# Patient Record
Sex: Male | Born: 1955
Health system: Southern US, Community
[De-identification: ages and names within clinical notes are randomized; demographics above are authoritative.]

## PROBLEM LIST (undated history)

## (undated) DIAGNOSIS — R945 Abnormal results of liver function studies: Secondary | ICD-10-CM

## (undated) DIAGNOSIS — N529 Male erectile dysfunction, unspecified: Secondary | ICD-10-CM

## (undated) DIAGNOSIS — F329 Major depressive disorder, single episode, unspecified: Secondary | ICD-10-CM

## (undated) DIAGNOSIS — N400 Enlarged prostate without lower urinary tract symptoms: Secondary | ICD-10-CM

## (undated) DIAGNOSIS — E786 Lipoprotein deficiency: Secondary | ICD-10-CM

## (undated) DIAGNOSIS — R7989 Other specified abnormal findings of blood chemistry: Secondary | ICD-10-CM

## (undated) DIAGNOSIS — I10 Essential (primary) hypertension: Secondary | ICD-10-CM

## (undated) DIAGNOSIS — K219 Gastro-esophageal reflux disease without esophagitis: Secondary | ICD-10-CM

## (undated) DIAGNOSIS — F32A Depression, unspecified: Secondary | ICD-10-CM

## (undated) DIAGNOSIS — T7840XA Allergy, unspecified, initial encounter: Secondary | ICD-10-CM

## (undated) HISTORY — DX: Other specified abnormal findings of blood chemistry: R79.89

## (undated) HISTORY — DX: Depression, unspecified: F32.A

## (undated) HISTORY — DX: Abnormal results of liver function studies: R94.5

## (undated) HISTORY — DX: Gastro-esophageal reflux disease without esophagitis: K21.9

## (undated) HISTORY — DX: Benign prostatic hyperplasia without lower urinary tract symptoms: N40.0

## (undated) HISTORY — DX: Allergy, unspecified, initial encounter: T78.40XA

## (undated) HISTORY — DX: Essential (primary) hypertension: I10

## (undated) HISTORY — PX: OTHER SURGICAL HISTORY: SHX169

## (undated) HISTORY — DX: Male erectile dysfunction, unspecified: N52.9

## (undated) HISTORY — DX: Major depressive disorder, single episode, unspecified: F32.9

## (undated) HISTORY — DX: Lipoprotein deficiency: E78.6

## (undated) HISTORY — PX: NASAL SEPTUM SURGERY: SHX37

---

## 1996-11-08 HISTORY — PX: OTHER SURGICAL HISTORY: SHX169

## 2001-03-10 ENCOUNTER — Emergency Department (HOSPITAL_COMMUNITY): Admission: EM | Admit: 2001-03-10 | Discharge: 2001-03-10 | Payer: Self-pay | Admitting: Emergency Medicine

## 2004-08-08 DIAGNOSIS — E786 Lipoprotein deficiency: Secondary | ICD-10-CM

## 2004-08-08 HISTORY — DX: Lipoprotein deficiency: E78.6

## 2004-12-07 ENCOUNTER — Ambulatory Visit: Payer: Self-pay | Admitting: Family Medicine

## 2005-05-03 ENCOUNTER — Ambulatory Visit: Payer: Self-pay | Admitting: Family Medicine

## 2005-06-25 ENCOUNTER — Ambulatory Visit: Payer: Self-pay | Admitting: Family Medicine

## 2006-09-05 ENCOUNTER — Ambulatory Visit: Payer: Self-pay | Admitting: Family Medicine

## 2009-07-08 ENCOUNTER — Encounter: Payer: Self-pay | Admitting: Family Medicine

## 2009-07-09 ENCOUNTER — Ambulatory Visit: Payer: Self-pay | Admitting: Family Medicine

## 2009-07-09 DIAGNOSIS — R079 Chest pain, unspecified: Secondary | ICD-10-CM | POA: Insufficient documentation

## 2009-07-10 LAB — CONVERTED CEMR LAB
ALT: 47 units/L (ref 0–53)
AST: 30 units/L (ref 0–37)
Albumin: 4.2 g/dL (ref 3.5–5.2)
Alkaline Phosphatase: 52 units/L (ref 39–117)
BUN: 10 mg/dL (ref 6–23)
Basophils Absolute: 0 10*3/uL (ref 0.0–0.1)
Basophils Relative: 0.3 % (ref 0.0–3.0)
Bilirubin, Direct: 0.1 mg/dL (ref 0.0–0.3)
CO2: 31 meq/L (ref 19–32)
Calcium: 9.7 mg/dL (ref 8.4–10.5)
Chloride: 105 meq/L (ref 96–112)
Cholesterol: 147 mg/dL (ref 0–200)
Creatinine, Ser: 0.9 mg/dL (ref 0.4–1.5)
Eosinophils Absolute: 0.2 10*3/uL (ref 0.0–0.7)
Eosinophils Relative: 2.7 % (ref 0.0–5.0)
GFR calc non Af Amer: 93.27 mL/min (ref >60)
Glucose, Bld: 84 mg/dL (ref 70–99)
HCT: 44.4 % (ref 39.0–52.0)
HDL: 30.9 mg/dL — ABNORMAL LOW (ref >39.00)
Hemoglobin: 15.1 g/dL (ref 13.0–17.0)
LDL Cholesterol: 99 mg/dL (ref 0–99)
Lymphocytes Relative: 29.7 % (ref 12.0–46.0)
Lymphs Abs: 2 10*3/uL (ref 0.7–4.0)
MCHC: 34 g/dL (ref 30.0–36.0)
MCV: 90.9 fL (ref 78.0–100.0)
Monocytes Absolute: 0.5 10*3/uL (ref 0.1–1.0)
Monocytes Relative: 7.6 % (ref 3.0–12.0)
Neutro Abs: 4 10*3/uL (ref 1.4–7.7)
Neutrophils Relative %: 59.7 % (ref 43.0–77.0)
PSA: 0.56 ng/mL (ref 0.10–4.00)
Platelets: 185 10*3/uL (ref 150.0–400.0)
Potassium: 4.6 meq/L (ref 3.5–5.1)
RBC: 4.88 M/uL (ref 4.22–5.81)
RDW: 12.5 % (ref 11.5–14.6)
Sodium: 141 meq/L (ref 135–145)
TSH: 1.23 microintl units/mL (ref 0.35–5.50)
Total Bilirubin: 0.9 mg/dL (ref 0.3–1.2)
Total CHOL/HDL Ratio: 5
Total Protein: 6.8 g/dL (ref 6.0–8.3)
Triglycerides: 85 mg/dL (ref 0.0–149.0)
VLDL: 17 mg/dL (ref 0.0–40.0)
WBC: 6.7 10*3/uL (ref 4.5–10.5)

## 2009-07-11 ENCOUNTER — Ambulatory Visit: Payer: Self-pay | Admitting: Cardiovascular Disease

## 2009-07-11 DIAGNOSIS — R072 Precordial pain: Secondary | ICD-10-CM | POA: Insufficient documentation

## 2009-07-11 DIAGNOSIS — R0602 Shortness of breath: Secondary | ICD-10-CM | POA: Insufficient documentation

## 2009-07-16 ENCOUNTER — Encounter: Payer: Self-pay | Admitting: Cardiovascular Disease

## 2009-07-29 ENCOUNTER — Telehealth (INDEPENDENT_AMBULATORY_CARE_PROVIDER_SITE_OTHER): Payer: Self-pay | Admitting: *Deleted

## 2009-07-30 ENCOUNTER — Ambulatory Visit: Payer: Self-pay

## 2009-07-30 ENCOUNTER — Encounter: Payer: Self-pay | Admitting: Cardiovascular Disease

## 2009-08-12 ENCOUNTER — Ambulatory Visit: Payer: Self-pay | Admitting: Cardiovascular Disease

## 2011-03-15 ENCOUNTER — Encounter: Payer: Self-pay | Admitting: Family Medicine

## 2011-03-16 ENCOUNTER — Encounter: Payer: Self-pay | Admitting: Family Medicine

## 2011-03-16 ENCOUNTER — Ambulatory Visit (INDEPENDENT_AMBULATORY_CARE_PROVIDER_SITE_OTHER): Payer: Private Health Insurance - Indemnity | Admitting: Family Medicine

## 2011-03-16 DIAGNOSIS — M549 Dorsalgia, unspecified: Secondary | ICD-10-CM

## 2011-03-16 MED ORDER — CYCLOBENZAPRINE HCL 10 MG PO TABS: 10.0000 mg | ORAL_TABLET | Freq: Three times a day (TID) | ORAL | Status: AC | PRN

## 2011-03-16 NOTE — Patient Instructions (Signed)
I would stretch as we discussed (back, hips and hamstrings).  Use the muscle relaxer at night and work on losing weight.  Take care.

## 2011-03-16 NOTE — Progress Notes (Signed)
Back pain. Going on intermittently for years.  Episodic pain getting out of bed in AM, on R side.  Prev only had the pain when getting up.  Over last few weeks, he's has more pain when laying down.  He has to roll around and get positioned to help with the pain.  Sleeps okay in recliner.  "I think my weight has something to do with it."  No weakness in legs.  No radiation into legs.  He used a heating pad.    Meds, vitals, and allergies reviewed.   ROS: See HPI.  Otherwise, noncontributory.  nad ncat Back w/o mildline pain R lumbar spine: paraspinal ttp.  Distally nv intact Hamstrings tight but slr neg.

## 2011-03-17 DIAGNOSIS — M549 Dorsalgia, unspecified: Secondary | ICD-10-CM | POA: Insufficient documentation

## 2011-03-17 DIAGNOSIS — M545 Low back pain, unspecified: Secondary | ICD-10-CM | POA: Insufficient documentation

## 2011-03-17 NOTE — Assessment & Plan Note (Signed)
Benign MSK exam.  No indication to image.  D/w pt about weight, hamstring, hip and back stretches.  PRN flexeril with sedation caution.  Fu prn.

## 2012-10-30 ENCOUNTER — Ambulatory Visit (INDEPENDENT_AMBULATORY_CARE_PROVIDER_SITE_OTHER): Payer: Private Health Insurance - Indemnity | Admitting: Family Medicine

## 2012-10-30 ENCOUNTER — Encounter: Payer: Self-pay | Admitting: Family Medicine

## 2012-10-30 VITALS — BP 104/72 | HR 66 | Temp 98.3°F | Ht 69.0 in | Wt 245.2 lb

## 2012-10-30 DIAGNOSIS — R109 Unspecified abdominal pain: Secondary | ICD-10-CM | POA: Insufficient documentation

## 2012-10-30 DIAGNOSIS — K5289 Other specified noninfective gastroenteritis and colitis: Secondary | ICD-10-CM

## 2012-10-30 DIAGNOSIS — K529 Noninfective gastroenteritis and colitis, unspecified: Secondary | ICD-10-CM

## 2012-10-30 NOTE — Progress Notes (Signed)
Subjective:    Patient ID: Eric Spears, male    DOB: 01-Jun-1956, 56 y.o.   MRN: [id number]  HPI Here with a stomach bug Started Thursday night  Had a drink at a bar and then ate some bar food  He got nauseated and vomited at the bar - thought he could have had food poisoning The next day - he felt sick at work - worse on Saturday His abd was rumbling , and had no appetite and also had a fever that came and went  Some abd pain - all over  Stayed in bed the next day- drank water and took asa for fever- and a lot of diarrhea  Was able to eat some pasta last night   Today still not feeling great  No appetite and abdomen just feels sensitive  Low grade fever comes and goes  No longer nauseated  No diarrhea today - but is bubbling and gurgling   Patient Active Problem List  Diagnosis  . DYSPNEA  . CHEST PAIN  . CHEST PAIN-PRECORDIAL  . Back pain   Past Medical History  Diagnosis Date  . Depression   . GERD (gastroesophageal reflux disease)   . BPH (benign prostatic hyperplasia)   . ED (erectile dysfunction)   . Low HDL (under 40) 10/05  . Elevated liver function tests    Past Surgical History  Procedure Date  . Stress cardiolite 1998    Normal  . Nasal septum surgery     Nose  . Finger trauma    History  Substance Use Topics  . Smoking status: Never Smoker   . Smokeless tobacco: Not on file  . Alcohol Use: 1.0 oz/week    2 drink(s) per week   Family History  Problem Relation Age of Onset  . Cancer Mother     uterine  . Heart disease Mother     valve dysfunction in heart  . Heart disease Father     CVA  . Diabetes Maternal Grandmother   . Obesity Other   . Cancer Other     were smokers   Allergies  Allergen Reactions  . Bee Venom    Current Outpatient Prescriptions on File Prior to Visit  Medication Sig Dispense Refill  . aspirin 325 MG EC tablet Take 325 mg by mouth 2 (two) times daily.       . cetirizine (ZYRTEC) 10 MG tablet Take 10 mg by mouth  daily as needed.        . fish oil-omega-3 fatty acids 1000 MG capsule Take 2 g by mouth daily.       . Flaxseed, Linseed, (FLAX SEED OIL) 1000 MG CAPS Take 1 capsule by mouth daily.        . Multiple Vitamin (MULTIVITAMIN) tablet Take 1 tablet by mouth daily.                Review of Systems Review of Systems  Constitutional: Negative for fever, appetite change, fatigue and unexpected weight change.  Eyes: Negative for pain and visual disturbance.  Respiratory: Negative for cough and shortness of breath.   Cardiovascular: Negative for cp or palpitations    Gastrointestinal: pos for nausea and mild abd pain / cramping with a lot of grumbling, neg for constipation/ vomiting/ blood in stool Genitourinary: Negative for urgency and frequency.  Skin: Negative for pallor or rash   Neurological: Negative for weakness, light-headedness, numbness and headaches.  Hematological: Negative for adenopathy. Does not bruise/bleed easily.  Psychiatric/Behavioral: Negative for dysphoric mood. The patient is not nervous/anxious.         Objective:   Physical Exam  Constitutional: He appears well-developed and well-nourished. No distress.       obese and well appearing   HENT:  Head: Normocephalic and atraumatic.  Mouth/Throat: Oropharynx is clear and moist.  Eyes: Conjunctivae normal and EOM are normal. Pupils are equal, round, and reactive to light. No scleral icterus.  Neck: Normal range of motion. Neck supple.  Cardiovascular: Normal rate, regular rhythm and normal heart sounds.   Pulmonary/Chest: Effort normal and breath sounds normal. He has no wheezes. He has no rales.  Abdominal: Soft. Bowel sounds are normal. He exhibits no distension and no mass. There is tenderness. There is no rebound and no guarding.       Mild tenderness over L abdomen-no rebound or gaurding bs are mildly overactive-not high pitched or tinkling   Neg murphy sign   Musculoskeletal: He exhibits no edema.       No  cva tenderness   Lymphadenopathy:    He has no cervical adenopathy.  Neurological: He is alert.  Skin: Skin is warm and dry. No rash noted. No erythema. No pallor.  Psychiatric: He has a normal mood and affect.          Assessment & Plan:

## 2012-10-30 NOTE — Assessment & Plan Note (Signed)
Suspect related to viral gastroenteritis - but in light of tenderness - labs today  See assessment for gastroenteritis-stop asa/ monitor/ bland diet

## 2012-10-30 NOTE — Patient Instructions (Addendum)
Stop aspirin  Keep sipping fluids - water / gatorade  BRAT diet - bananas / applesauce/ rice Caryl Comes - until your appetite starts to come back  Tylenol for fever  Labs today - and will update you  If you get worse at any point - call and let us know

## 2012-10-30 NOTE — Assessment & Plan Note (Signed)
Suspect viral - since symptoms are gradually improving Adv to change from asa to tylenol for fever  Labs in light of abd pain  BRAT diet and rest Fluids - to avoid dehydration

## 2012-10-31 LAB — COMPREHENSIVE METABOLIC PANEL
ALT: 39 U/L (ref 0–53)
AST: 34 U/L (ref 0–37)
Albumin: 3.9 g/dL (ref 3.5–5.2)
Alkaline Phosphatase: 39 U/L (ref 39–117)
BUN: 12 mg/dL (ref 6–23)
CO2: 28 mEq/L (ref 19–32)
Calcium: 9.4 mg/dL (ref 8.4–10.5)
Chloride: 99 mEq/L (ref 96–112)
Creatinine, Ser: 0.9 mg/dL (ref 0.4–1.5)
GFR: 95.83 mL/min (ref >60.00)
Glucose, Bld: 89 mg/dL (ref 70–99)
Potassium: 4.4 mEq/L (ref 3.5–5.1)
Sodium: 136 mEq/L (ref 135–145)
Total Bilirubin: 0.6 mg/dL (ref 0.3–1.2)
Total Protein: 6.9 g/dL (ref 6.0–8.3)

## 2012-10-31 LAB — CBC WITH DIFFERENTIAL/PLATELET
Basophils Absolute: 0 10*3/uL (ref 0.0–0.1)
Basophils Relative: 0.2 % (ref 0.0–3.0)
Eosinophils Absolute: 0.1 10*3/uL (ref 0.0–0.7)
Eosinophils Relative: 1.6 % (ref 0.0–5.0)
HCT: 48.2 % (ref 39.0–52.0)
Hemoglobin: 16.2 g/dL (ref 13.0–17.0)
Lymphocytes Relative: 22.4 % (ref 12.0–46.0)
Lymphs Abs: 1.6 10*3/uL (ref 0.7–4.0)
MCHC: 33.6 g/dL (ref 30.0–36.0)
MCV: 90.2 fl (ref 78.0–100.0)
Monocytes Absolute: 0.5 10*3/uL (ref 0.1–1.0)
Monocytes Relative: 6.7 % (ref 3.0–12.0)
Neutro Abs: 4.8 10*3/uL (ref 1.4–7.7)
Neutrophils Relative %: 69.1 % (ref 43.0–77.0)
Platelets: 192 10*3/uL (ref 150.0–400.0)
RBC: 5.34 Mil/uL (ref 4.22–5.81)
RDW: 13.5 % (ref 11.5–14.6)
WBC: 7 10*3/uL (ref 4.5–10.5)

## 2012-10-31 LAB — AMYLASE: Amylase: 29 U/L (ref 27–131)

## 2012-10-31 LAB — LIPASE: Lipase: 17 U/L (ref 11.0–59.0)

## 2014-07-03 ENCOUNTER — Telehealth: Payer: Self-pay

## 2014-07-03 DIAGNOSIS — Z1211 Encounter for screening for malignant neoplasm of colon: Secondary | ICD-10-CM

## 2014-07-03 NOTE — Telephone Encounter (Signed)
Ref done  

## 2014-07-03 NOTE — Telephone Encounter (Signed)
Pt left v/m requesting appt for colonoscopy; pt last seen 10/30/2012; pt is scheduled for CPX on 11/13/2014.

## 2014-08-23 ENCOUNTER — Ambulatory Visit (AMBULATORY_SURGERY_CENTER): Payer: Managed Care, Other (non HMO) | Admitting: *Deleted

## 2014-08-23 VITALS — Ht 69.0 in | Wt 244.8 lb

## 2014-08-23 DIAGNOSIS — Z1211 Encounter for screening for malignant neoplasm of colon: Secondary | ICD-10-CM

## 2014-08-23 MED ORDER — MOVIPREP 100 G PO SOLR
1.0000 | Freq: Once | ORAL | Status: DC
Start: 2014-08-23 — End: 2014-09-05

## 2014-08-23 NOTE — Progress Notes (Signed)
No egg or soy allergy. ewm No home 02 use. ewm No cpap. ewm No diet pills. ewm No problems with past sedation. ewm emmi video to pt's e mail. ewm

## 2014-09-02 ENCOUNTER — Encounter: Payer: Self-pay | Admitting: Gastroenterology

## 2014-09-05 ENCOUNTER — Encounter: Payer: Self-pay | Admitting: Gastroenterology

## 2014-09-05 ENCOUNTER — Ambulatory Visit (AMBULATORY_SURGERY_CENTER): Payer: Managed Care, Other (non HMO) | Admitting: Gastroenterology

## 2014-09-05 VITALS — BP 104/74 | HR 51 | Temp 96.9°F | Resp 20 | Ht 69.0 in | Wt 244.0 lb

## 2014-09-05 DIAGNOSIS — D124 Benign neoplasm of descending colon: Secondary | ICD-10-CM

## 2014-09-05 DIAGNOSIS — D120 Benign neoplasm of cecum: Secondary | ICD-10-CM

## 2014-09-05 DIAGNOSIS — K635 Polyp of colon: Secondary | ICD-10-CM

## 2014-09-05 DIAGNOSIS — D123 Benign neoplasm of transverse colon: Secondary | ICD-10-CM

## 2014-09-05 DIAGNOSIS — D122 Benign neoplasm of ascending colon: Secondary | ICD-10-CM

## 2014-09-05 DIAGNOSIS — Z1211 Encounter for screening for malignant neoplasm of colon: Secondary | ICD-10-CM

## 2014-09-05 MED ORDER — SODIUM CHLORIDE 0.9 % IV SOLN: 500.0000 mL | INTRAVENOUS | Status: DC

## 2014-09-05 NOTE — Progress Notes (Signed)
Called to room to assist during endoscopic procedure.  Patient ID and intended procedure confirmed with present staff. Received instructions for my participation in the procedure from the performing physician.  

## 2014-09-05 NOTE — Progress Notes (Signed)
A/ox3 pleased with MAC, report to Annette RN 

## 2014-09-05 NOTE — Patient Instructions (Signed)
YOU HAD AN ENDOSCOPIC PROCEDURE TODAY AT THE [facility]: Refer to the procedure report that was given to you for any specific questions about what was found during the examination.  If the procedure report does not answer your questions, please call your gastroenterologist to clarify.  If you requested that your care partner not be given the details of your procedure findings, then the procedure report has been included in a sealed envelope for you to review at your convenience later.  YOU SHOULD EXPECT: Some feelings of bloating in the abdomen. Passage of more gas than usual.  Walking can help get rid of the air that was put into your GI tract during the procedure and reduce the bloating. If you had a lower endoscopy (such as a colonoscopy or flexible sigmoidoscopy) you may notice spotting of blood in your stool or on the toilet paper. If you underwent a bowel prep for your procedure, then you may not have a normal bowel movement for a few days.  DIET: Your first meal following the procedure should be a light meal and then it is ok to progress to your normal diet.  A half-sandwich or bowl of soup is an example of a good first meal.  Heavy or fried foods are harder to digest and may make you feel nauseous or bloated.  Likewise meals heavy in dairy and vegetables can cause extra gas to form and this can also increase the bloating.  Drink plenty of fluids but you should avoid alcoholic beverages for 24 hours.  ACTIVITY: Your care partner should take you home directly after the procedure.  You should plan to take it easy, moving slowly for the rest of the day.  You can resume normal activity the day after the procedure however you should NOT DRIVE or use heavy machinery for 24 hours (because of the sedation medicines used during the test).    SYMPTOMS TO REPORT IMMEDIATELY: A gastroenterologist can be reached at any hour.  During normal business hours, 8:30 AM to 5:00 PM Monday through Friday,  call [phone].  After hours and on weekends, please call the GI answering service at [phone] who will take a message and have the physician on call contact you.   Following lower endoscopy (colonoscopy or flexible sigmoidoscopy):  Excessive amounts of blood in the stool  Significant tenderness or worsening of abdominal pains  Swelling of the abdomen that is new, acute  Fever of 100F or higher  FOLLOW UP: If any biopsies were taken you will be contacted by phone or by letter within the next 1-3 weeks.  Call your gastroenterologist if you have not heard about the biopsies in 3 weeks.  Our staff will call the home number listed on your records the next business day following your procedure to check on you and address any questions or concerns that you may have at that time regarding the information given to you following your procedure. This is a courtesy call and so if there is no answer at the home number and we have not heard from you through the emergency physician on call, we will assume that you have returned to your regular daily activities without incident.  SIGNATURES/CONFIDENTIALITY: You and/or your care partner have signed paperwork which will be entered into your electronic medical record.  These signatures attest to the fact that that the information above on your After Visit Summary has been reviewed and is understood.  Full responsibility of the confidentiality of this   information lies with you and/or your care-partner.    Handout was given to your care partner on polyps. You may resume your current medications today. Await biopsy results. Please call if any questions or concerns.   

## 2014-09-05 NOTE — Progress Notes (Signed)
No problems noted in the recovery room. maw 

## 2014-09-05 NOTE — Op Note (Signed)
[facility]  [address] [place], [id number]   COLONOSCOPY PROCEDURE REPORT  PATIENT: Eric Spears, Eric Spears  MR#: [id number] BIRTHDATE: July 19, 1956 , 77  yrs. old GENDER: male ENDOSCOPIST: Ladene Artist, MD, [facility] REFERRED LH:TDSKA Vernell Morgans, M.D. PROCEDURE DATE:  09/05/2014 PROCEDURE:   Colonoscopy with biopsy and Colonoscopy with snare polypectomy First Screening Colonoscopy - Avg.  risk and is 50 yrs.  old or older Yes.  Prior Negative Screening - Now for repeat screening. N/A  History of Adenoma - Now for follow-up colonoscopy & has been > or = to 3 yrs.  N/A  Polyps Removed Today? Yes. ASA CLASS:   Class II INDICATIONS:average risk for colorectal cancer. MEDICATIONS: Monitored anesthesia care and Propofol 250 mg IV DESCRIPTION OF PROCEDURE:   After the risks benefits and alternatives of the procedure were thoroughly explained, informed consent was obtained.  The digital rectal exam revealed no abnormalities of the rectum.   The LB [id number]  endoscope was introduced through the anus and advanced to the cecum, which was identified by both the appendix and ileocecal valve. No adverse events experienced.   The quality of the prep was excellent, using MoviPrep  The instrument was then slowly withdrawn as the colon was fully examined.  COLON FINDINGS: Three sessile polyps measuring 4 mm in size were found in the ascending colon and at the cecum.  A polypectomy was performed with cold forceps.  The resection was complete, the polyp tissue was completely retrieved and sent to histology.   Three sessile polyps measuring 6 mm in size were found in the descending colon, transverse colon, and ascending colon.  A polypectomy was performed with a cold snare.  The resection was complete, the polyp tissue was completely retrieved and sent to histology.   The examination was otherwise normal.  Retroflexed views revealed no abnormalities. The time to cecum=3 minutes 17  seconds.  Withdrawal time=16 minutes 37 seconds.  The scope was withdrawn and the procedure completed.  COMPLICATIONS: There were no immediate complications.  ENDOSCOPIC IMPRESSION: 1.   Three sessile polyps in the ascending colon and at the cecum; polypectomy was performed with cold forceps 2.   Three sessile polyps in the descending, transverse, and ascending colon; polypectomy performed with a cold snare 3.   The examination was otherwise normal  RECOMMENDATIONS: 1.  Await pathology results 2.  Repeat colonoscopy in 3 years if 3 or more polyps adenomatous; 5 years if 1-2 adenomatous; otherwise 10 years  eSigned:  Ladene Artist, MD, [facility] 09/05/2014 9:08 AM

## 2014-09-06 ENCOUNTER — Telehealth: Payer: Self-pay | Admitting: *Deleted

## 2014-09-06 NOTE — Telephone Encounter (Signed)
  Follow up Call-  Call back number 09/05/2014  Post procedure Call Back phone  # [phone]  Permission to leave phone message Yes     Patient questions:  Do you have a fever, pain , or abdominal swelling? No. Pain Score  0 *  Have you tolerated food without any problems? Yes.    Have you been able to return to your normal activities? Yes.    Do you have any questions about your discharge instructions: Diet   No. Medications  No. Follow up visit  No.  Do you have questions or concerns about your Care? No.  Actions: * If pain score is 4 or above: No action needed, pain <4.

## 2014-09-11 ENCOUNTER — Encounter: Payer: Self-pay | Admitting: Gastroenterology

## 2014-11-05 ENCOUNTER — Telehealth: Payer: Self-pay

## 2014-11-05 ENCOUNTER — Telehealth: Payer: Self-pay | Admitting: Family Medicine

## 2014-11-05 DIAGNOSIS — Z0000 Encounter for general adult medical examination without abnormal findings: Secondary | ICD-10-CM | POA: Insufficient documentation

## 2014-11-05 DIAGNOSIS — Z125 Encounter for screening for malignant neoplasm of prostate: Secondary | ICD-10-CM | POA: Insufficient documentation

## 2014-11-05 NOTE — Telephone Encounter (Signed)
-----   Message from Ellamae Sia sent at 10/29/2014  3:31 PM EST ----- Regarding: Lab orders for Wednesday, 12.30.15 Patient is scheduled for CPX labs, please order future labs, Thanks , Karna Christmas

## 2014-11-05 NOTE — Telephone Encounter (Signed)
Pt left v/m requesting cb and to leave detailed v/m if pt can eat prior to coming for lab on 11/06/14; left v/m advising pt could eat lunch and then have water or coffee 3 - 4 hours prior to lab test at 4 pm.advised if further questions to cb.

## 2014-11-06 ENCOUNTER — Other Ambulatory Visit (INDEPENDENT_AMBULATORY_CARE_PROVIDER_SITE_OTHER): Payer: Managed Care, Other (non HMO)

## 2014-11-06 DIAGNOSIS — Z0000 Encounter for general adult medical examination without abnormal findings: Secondary | ICD-10-CM

## 2014-11-06 DIAGNOSIS — Z125 Encounter for screening for malignant neoplasm of prostate: Secondary | ICD-10-CM

## 2014-11-07 LAB — COMPREHENSIVE METABOLIC PANEL
ALT: 30 U/L (ref 0–53)
AST: 25 U/L (ref 0–37)
Albumin: 4.4 g/dL (ref 3.5–5.2)
Alkaline Phosphatase: 44 U/L (ref 39–117)
BUN: 14 mg/dL (ref 6–23)
CO2: 29 mEq/L (ref 19–32)
Calcium: 9.6 mg/dL (ref 8.4–10.5)
Chloride: 103 mEq/L (ref 96–112)
Creatinine, Ser: 0.7 mg/dL (ref 0.4–1.5)
GFR: 114.71 mL/min (ref >60.00)
Glucose, Bld: 80 mg/dL (ref 70–99)
Potassium: 4.5 mEq/L (ref 3.5–5.1)
Sodium: 138 mEq/L (ref 135–145)
Total Bilirubin: 0.6 mg/dL (ref 0.2–1.2)
Total Protein: 6.8 g/dL (ref 6.0–8.3)

## 2014-11-07 LAB — CBC WITH DIFFERENTIAL/PLATELET
Basophils Absolute: 0.1 10*3/uL (ref 0.0–0.1)
Basophils Relative: 0.7 % (ref 0.0–3.0)
Eosinophils Absolute: 0.2 10*3/uL (ref 0.0–0.7)
Eosinophils Relative: 1.6 % (ref 0.0–5.0)
HCT: 44.6 % (ref 39.0–52.0)
Hemoglobin: 14.7 g/dL (ref 13.0–17.0)
Lymphocytes Relative: 19.5 % (ref 12.0–46.0)
Lymphs Abs: 2 10*3/uL (ref 0.7–4.0)
MCHC: 33 g/dL (ref 30.0–36.0)
MCV: 90.6 fl (ref 78.0–100.0)
Monocytes Absolute: 0.6 10*3/uL (ref 0.1–1.0)
Monocytes Relative: 6.3 % (ref 3.0–12.0)
Neutro Abs: 7.4 10*3/uL (ref 1.4–7.7)
Neutrophils Relative %: 71.9 % (ref 43.0–77.0)
Platelets: 214 10*3/uL (ref 150.0–400.0)
RBC: 4.92 Mil/uL (ref 4.22–5.81)
RDW: 13.3 % (ref 11.5–15.5)
WBC: 10.3 10*3/uL (ref 4.0–10.5)

## 2014-11-07 LAB — TSH: TSH: 1.14 u[IU]/mL (ref 0.35–4.50)

## 2014-11-07 LAB — LIPID PANEL
Cholesterol: 156 mg/dL (ref 0–200)
HDL: 32.1 mg/dL — ABNORMAL LOW (ref >39.00)
LDL Cholesterol: 102 mg/dL — ABNORMAL HIGH (ref 0–99)
NonHDL: 123.9
Total CHOL/HDL Ratio: 5
Triglycerides: 109 mg/dL (ref 0.0–149.0)
VLDL: 21.8 mg/dL (ref 0.0–40.0)

## 2014-11-07 LAB — PSA: PSA: 0.41 ng/mL (ref 0.10–4.00)

## 2014-11-13 ENCOUNTER — Ambulatory Visit (INDEPENDENT_AMBULATORY_CARE_PROVIDER_SITE_OTHER): Payer: Managed Care, Other (non HMO) | Admitting: Family Medicine

## 2014-11-13 ENCOUNTER — Encounter: Payer: Self-pay | Admitting: Family Medicine

## 2014-11-13 VITALS — BP 130/74 | HR 54 | Temp 98.5°F | Ht 69.0 in | Wt 241.5 lb

## 2014-11-13 DIAGNOSIS — Z1211 Encounter for screening for malignant neoplasm of colon: Secondary | ICD-10-CM

## 2014-11-13 DIAGNOSIS — Z23 Encounter for immunization: Secondary | ICD-10-CM

## 2014-11-13 DIAGNOSIS — Z125 Encounter for screening for malignant neoplasm of prostate: Secondary | ICD-10-CM

## 2014-11-13 DIAGNOSIS — Z0000 Encounter for general adult medical examination without abnormal findings: Secondary | ICD-10-CM

## 2014-11-13 DIAGNOSIS — G4710 Hypersomnia, unspecified: Secondary | ICD-10-CM

## 2014-11-13 DIAGNOSIS — R400 Somnolence: Secondary | ICD-10-CM

## 2014-11-13 NOTE — Progress Notes (Signed)
Pre visit review using our clinic review tool, if applicable. No additional management support is needed unless otherwise documented below in the visit note. 

## 2014-11-13 NOTE — Progress Notes (Signed)
Subjective:    Patient ID: Eric Spears, male    DOB: November 08, 1956, 59 y.o.   MRN: [id number]  HPI Here for health maintenance exam and to review chronic medical problems    Doing ok overall  Nothing new going on  He has noted that he dozes off easily when sitting  He sleeps at night 5 hours and then wakes up - can go back to sleep    (big coffee drinker)  Does snore - loudly at times   He would be ? Open to a sleep study    Wt is down 3 lb with bmi of 35 He would like to loose weight  He does not exercise and is too tired with working a lot of hours  Disc bmi and optimal weight  He weighed 155 after HS and in the [employer] - then he quit doing physical work  Food is one of his pleasures and relief from stress  No sweets or sodas however    HIV screen -does not think he needs at this time /no high risk behavior  Td- needs a Tdap Flu shot will get today   Drinks 2 drinks per day   colonosc 10/15 polyps- 5 years  Procedure went well    Results for orders placed or performed in visit on 11/06/14  CBC with Differential  Result Value Ref Range   WBC 10.3 4.0 - 10.5 K/uL   RBC 4.92 4.22 - 5.81 Mil/uL   Hemoglobin 14.7 13.0 - 17.0 g/dL   HCT 44.6 39.0 - 52.0 %   MCV 90.6 78.0 - 100.0 fl   MCHC 33.0 30.0 - 36.0 g/dL   RDW 13.3 11.5 - 15.5 %   Platelets 214.0 150.0 - 400.0 K/uL   Neutrophils Relative % 71.9 43.0 - 77.0 %   Lymphocytes Relative 19.5 12.0 - 46.0 %   Monocytes Relative 6.3 3.0 - 12.0 %   Eosinophils Relative 1.6 0.0 - 5.0 %   Basophils Relative 0.7 0.0 - 3.0 %   Neutro Abs 7.4 1.4 - 7.7 K/uL   Lymphs Abs 2.0 0.7 - 4.0 K/uL   Monocytes Absolute 0.6 0.1 - 1.0 K/uL   Eosinophils Absolute 0.2 0.0 - 0.7 K/uL   Basophils Absolute 0.1 0.0 - 0.1 K/uL  Comprehensive metabolic panel  Result Value Ref Range   Sodium 138 135 - 145 mEq/L   Potassium 4.5 3.5 - 5.1 mEq/L   Chloride 103 96 - 112 mEq/L   CO2 29 19 - 32 mEq/L   Glucose, Bld 80 70 - 99 mg/dL   BUN 14 6 -  23 mg/dL   Creatinine, Ser 0.7 0.4 - 1.5 mg/dL   Total Bilirubin 0.6 0.2 - 1.2 mg/dL   Alkaline Phosphatase 44 39 - 117 U/L   AST 25 0 - 37 U/L   ALT 30 0 - 53 U/L   Total Protein 6.8 6.0 - 8.3 g/dL   Albumin 4.4 3.5 - 5.2 g/dL   Calcium 9.6 8.4 - 10.5 mg/dL   GFR 114.71 >60.00 mL/min  Lipid panel  Result Value Ref Range   Cholesterol 156 0 - 200 mg/dL   Triglycerides 109.0 0.0 - 149.0 mg/dL   HDL 32.10 (L) >39.00 mg/dL   VLDL 21.8 0.0 - 40.0 mg/dL   LDL Cholesterol 102 (H) 0 - 99 mg/dL   Total CHOL/HDL Ratio 5    NonHDL 123.90   PSA  Result Value Ref Range   PSA 0.41 0.10 -  4.00 ng/mL  TSH  Result Value Ref Range   TSH 1.14 0.35 - 4.50 uIU/mL     Prostate cancer screen Lab Results  Component Value Date   PSA 0.41 11/06/2014   PSA 0.56 07/09/2009      Lab Results  Component Value Date   CHOL 156 11/06/2014   CHOL 147 07/09/2009   Lab Results  Component Value Date   HDL 32.10* 11/06/2014   HDL 30.90* 07/09/2009   Lab Results  Component Value Date   LDLCALC 102* 11/06/2014   LDLCALC 99 07/09/2009   Lab Results  Component Value Date   TRIG 109.0 11/06/2014   TRIG 85.0 07/09/2009   Lab Results  Component Value Date   CHOLHDL 5 11/06/2014   CHOLHDL 5 07/09/2009   No results found for: LDLDIRECT     Review of Systems Review of Systems  Constitutional: Negative for fever, appetite change,  and unexpected weight change. pos for daytime sleepiness and snoring Eyes: Negative for pain and visual disturbance.  Respiratory: Negative for cough and shortness of breath.   Cardiovascular: Negative for cp or palpitations    Gastrointestinal: Negative for nausea, diarrhea and constipation.  Genitourinary: Negative for urgency and frequency.  Skin: Negative for pallor or rash   Neurological: Negative for weakness, light-headedness, numbness and headaches.  Hematological: Negative for adenopathy. Does not bruise/bleed easily.  Psychiatric/Behavioral: Negative for  dysphoric mood. The patient is not nervous/anxious.         Objective:   Physical Exam  Constitutional: He appears well-developed and well-nourished. No distress.  obese and well appearing   HENT:  Head: Normocephalic and atraumatic.  Right Ear: External ear normal.  Left Ear: External ear normal.  Nose: Nose normal.  Mouth/Throat: Oropharynx is clear and moist.  Eyes: Conjunctivae and EOM are normal. Pupils are equal, round, and reactive to light. Right eye exhibits no discharge. Left eye exhibits no discharge. No scleral icterus.  Neck: Normal range of motion. Neck supple. No JVD present. Carotid bruit is not present. No thyromegaly present.  Cardiovascular: Normal rate, regular rhythm, normal heart sounds and intact distal pulses.  Exam reveals no gallop.   Pulmonary/Chest: Effort normal and breath sounds normal. No respiratory distress. He has no wheezes. He exhibits no tenderness.  Abdominal: Soft. Bowel sounds are normal. He exhibits no distension, no abdominal bruit and no mass. There is no tenderness.  Musculoskeletal: He exhibits no edema or tenderness.  Lymphadenopathy:    He has no cervical adenopathy.  Neurological: He is alert. He has normal reflexes. No cranial nerve deficit. He exhibits normal muscle tone. Coordination normal.  Skin: Skin is warm and dry. No rash noted. No erythema. No pallor.  Psychiatric: He has a normal mood and affect.  Seems generally tired          Assessment & Plan:   Problem List Items Addressed This Visit      Other   Colon cancer screening    utd colonoscopy Hx of polyps     Prostate cancer screening    Lab Results  Component Value Date   PSA 0.41 11/06/2014   PSA 0.56 07/09/2009   No symptoms  No family history  Will continue to monitor     Routine general medical examination at a health care facility - Primary    Reviewed health habits including diet and exercise and skin cancer prevention Reviewed appropriate screening  tests for age  Also reviewed health mt list, fam hx and immunization  status , as well as social and family history   See HPI Labs reviewed Tdap and flu shots today    Somnolence, daytime    Suspect sleep apnea Disc this in detail  He is considering a referral to sleep clinic      Other Visit Diagnoses    Need for prophylactic vaccination and inoculation against influenza        Relevant Orders       Flu Vaccine QUAD 36+ mos IM (Completed)    Need for prophylactic vaccination with combined diphtheria-tetanus-pertussis (DTP) vaccine        Relevant Orders       Tdap vaccine greater than or equal to 7yo IM (Completed)

## 2014-11-13 NOTE — Patient Instructions (Signed)
Tetanus shot today Flu shot today  I would like to refer you to our sleep clinic in [place] - the pulmonary sleep specialist would evaluate you for sleep apnea and decide what type of sleep study you would need  Let me know if you would like this referral  Take care of yourself  Think about starting an exercise program and working on weight loss

## 2014-11-14 DIAGNOSIS — R400 Somnolence: Secondary | ICD-10-CM | POA: Insufficient documentation

## 2014-11-14 NOTE — Assessment & Plan Note (Signed)
Reviewed health habits including diet and exercise and skin cancer prevention Reviewed appropriate screening tests for age  Also reviewed health mt list, fam hx and immunization status , as well as social and family history   See HPI Labs reviewed Tdap and flu shots today

## 2014-11-14 NOTE — Assessment & Plan Note (Signed)
Suspect sleep apnea Disc this in detail  He is considering a referral to sleep clinic

## 2014-11-14 NOTE — Assessment & Plan Note (Signed)
utd colonoscopy Hx of polyps

## 2014-11-14 NOTE — Assessment & Plan Note (Signed)
Lab Results  Component Value Date   PSA 0.41 11/06/2014   PSA 0.56 07/09/2009   No symptoms  No family history  Will continue to monitor

## 2016-10-07 ENCOUNTER — Telehealth: Payer: Self-pay | Admitting: Family Medicine

## 2016-10-07 DIAGNOSIS — Z0000 Encounter for general adult medical examination without abnormal findings: Secondary | ICD-10-CM

## 2016-10-07 DIAGNOSIS — Z125 Encounter for screening for malignant neoplasm of prostate: Secondary | ICD-10-CM

## 2016-10-07 NOTE — Telephone Encounter (Signed)
-----   Message from Marchia Bond sent at 10/06/2016 10:29 AM EST ----- Regarding: Cpx labs Tues 12/5, need orders. Thanks! :-) Please order  future cpx labs for pt's upcoming lab appt. Thanks Aniceto Boss

## 2016-10-11 ENCOUNTER — Telehealth: Payer: Self-pay | Admitting: Family Medicine

## 2016-10-11 DIAGNOSIS — Z1159 Encounter for screening for other viral diseases: Secondary | ICD-10-CM | POA: Insufficient documentation

## 2016-10-11 NOTE — Telephone Encounter (Signed)
Pt would like to be checked for hep c  He is not sure if he has been checked or not. He has labs on 12/6 Thanks

## 2016-10-11 NOTE — Telephone Encounter (Signed)
Thanks I will add that

## 2016-10-13 ENCOUNTER — Other Ambulatory Visit (INDEPENDENT_AMBULATORY_CARE_PROVIDER_SITE_OTHER): Payer: Managed Care, Other (non HMO)

## 2016-10-13 ENCOUNTER — Encounter (INDEPENDENT_AMBULATORY_CARE_PROVIDER_SITE_OTHER): Payer: Self-pay

## 2016-10-13 DIAGNOSIS — Z125 Encounter for screening for malignant neoplasm of prostate: Secondary | ICD-10-CM | POA: Diagnosis not present

## 2016-10-13 DIAGNOSIS — Z1159 Encounter for screening for other viral diseases: Secondary | ICD-10-CM

## 2016-10-13 DIAGNOSIS — Z0000 Encounter for general adult medical examination without abnormal findings: Secondary | ICD-10-CM | POA: Diagnosis not present

## 2016-10-13 LAB — COMPREHENSIVE METABOLIC PANEL
ALT: 44 U/L (ref 0–53)
AST: 28 U/L (ref 0–37)
Albumin: 4.3 g/dL (ref 3.5–5.2)
Alkaline Phosphatase: 57 U/L (ref 39–117)
BUN: 15 mg/dL (ref 6–23)
CO2: 31 mEq/L (ref 19–32)
Calcium: 9.5 mg/dL (ref 8.4–10.5)
Chloride: 106 mEq/L (ref 96–112)
Creatinine, Ser: 0.74 mg/dL (ref 0.40–1.50)
GFR: 113.98 mL/min (ref >60.00)
Glucose, Bld: 105 mg/dL — ABNORMAL HIGH (ref 70–99)
Potassium: 4.8 mEq/L (ref 3.5–5.1)
Sodium: 143 mEq/L (ref 135–145)
Total Bilirubin: 0.5 mg/dL (ref 0.2–1.2)
Total Protein: 6.7 g/dL (ref 6.0–8.3)

## 2016-10-13 LAB — CBC WITH DIFFERENTIAL/PLATELET
Basophils Absolute: 0 10*3/uL (ref 0.0–0.1)
Basophils Relative: 0.6 % (ref 0.0–3.0)
Eosinophils Absolute: 0.2 10*3/uL (ref 0.0–0.7)
Eosinophils Relative: 4.1 % (ref 0.0–5.0)
HCT: 44.2 % (ref 39.0–52.0)
Hemoglobin: 15.1 g/dL (ref 13.0–17.0)
Lymphocytes Relative: 29.4 % (ref 12.0–46.0)
Lymphs Abs: 1.8 10*3/uL (ref 0.7–4.0)
MCHC: 34.2 g/dL (ref 30.0–36.0)
MCV: 88.9 fl (ref 78.0–100.0)
Monocytes Absolute: 0.5 10*3/uL (ref 0.1–1.0)
Monocytes Relative: 8.5 % (ref 3.0–12.0)
Neutro Abs: 3.5 10*3/uL (ref 1.4–7.7)
Neutrophils Relative %: 57.4 % (ref 43.0–77.0)
Platelets: 200 10*3/uL (ref 150.0–400.0)
RBC: 4.97 Mil/uL (ref 4.22–5.81)
RDW: 13.7 % (ref 11.5–15.5)
WBC: 6.1 10*3/uL (ref 4.0–10.5)

## 2016-10-13 LAB — PSA: PSA: 0.42 ng/mL (ref 0.10–4.00)

## 2016-10-13 LAB — LIPID PANEL
Cholesterol: 159 mg/dL (ref 0–200)
HDL: 39.6 mg/dL (ref >39.00)
LDL Cholesterol: 100 mg/dL — ABNORMAL HIGH (ref 0–99)
NonHDL: 119.31
Total CHOL/HDL Ratio: 4
Triglycerides: 99 mg/dL (ref 0.0–149.0)
VLDL: 19.8 mg/dL (ref 0.0–40.0)

## 2016-10-13 LAB — TSH: TSH: 1.94 u[IU]/mL (ref 0.35–4.50)

## 2016-10-14 LAB — HEPATITIS C ANTIBODY: HCV Ab: NEGATIVE

## 2016-10-18 ENCOUNTER — Encounter: Payer: Self-pay | Admitting: Family Medicine

## 2016-10-18 ENCOUNTER — Ambulatory Visit (INDEPENDENT_AMBULATORY_CARE_PROVIDER_SITE_OTHER): Payer: Managed Care, Other (non HMO) | Admitting: Family Medicine

## 2016-10-18 ENCOUNTER — Encounter: Payer: Managed Care, Other (non HMO) | Admitting: Family Medicine

## 2016-10-18 VITALS — BP 132/84 | HR 59 | Temp 98.1°F | Ht 69.0 in | Wt 264.8 lb

## 2016-10-18 DIAGNOSIS — Z0000 Encounter for general adult medical examination without abnormal findings: Secondary | ICD-10-CM | POA: Diagnosis not present

## 2016-10-18 DIAGNOSIS — R7309 Other abnormal glucose: Secondary | ICD-10-CM

## 2016-10-18 DIAGNOSIS — Z125 Encounter for screening for malignant neoplasm of prostate: Secondary | ICD-10-CM

## 2016-10-18 DIAGNOSIS — Z1159 Encounter for screening for other viral diseases: Secondary | ICD-10-CM | POA: Diagnosis not present

## 2016-10-18 NOTE — Progress Notes (Signed)
Pre visit review using our clinic review tool, if applicable. No additional management support is needed unless otherwise documented below in the visit note. 

## 2016-10-18 NOTE — Assessment & Plan Note (Signed)
Watch this Disc low carb diet and exercise and wt loss to prevent DM2

## 2016-10-18 NOTE — Assessment & Plan Note (Signed)
Lab Results  Component Value Date   PSA 0.42 10/13/2016   PSA 0.41 11/06/2014   PSA 0.56 07/09/2009   No new symptoms Continue to follow No fam hx

## 2016-10-18 NOTE — Assessment & Plan Note (Signed)
Neg result.

## 2016-10-18 NOTE — Progress Notes (Signed)
Subjective:    Patient ID: Eric Spears, male    DOB: 05-06-1956, 60 y.o.   MRN: [id number]  HPI Here for health maintenance exam and to review chronic medical problems     Wt Readings from Last 3 Encounters:  10/18/16 264 lb 12 oz (120.1 kg)  11/13/14 241 lb 8 oz (109.5 kg)  09/05/14 244 lb (110.7 kg)  bmi 39.1 Eating too much - and also eating the wrong foods  Stressing out (? Stress eat)  Working too much  No exercising  Not a lot of time to care for himself - has to take time   Not ready to make a lot of changes  Could walk to the end of the road and back - 3 mi considering it daily   Lives with his mother and takes care of her (leaves little time for self care)   HIV screen -no interested / low risk   Zoster vaccine -is interested/ will check with insurance   Flu shot 9/17  Colonoscopy 10/15- fragments of adenoma - 5 year recall (he is aware)   Tetanus shot 1/16  Hep C screen this mo-negative   Lab Results  Component Value Date   PSA 0.42 10/13/2016   PSA 0.41 11/06/2014   PSA 0.56 07/09/2009   hx of BPH  No problems emptying bladder overall  Urinates frequently if he drinks a lot of coffee in the am  No family hx of prostate cancer  Father had BPH -no cancer   BP Readings from Last 3 Encounters:  10/18/16 132/84  11/13/14 130/74  09/05/14 104/74   Results for orders placed or performed in visit on 10/13/16  CBC with Differential/Platelet  Result Value Ref Range   WBC 6.1 4.0 - 10.5 K/uL   RBC 4.97 4.22 - 5.81 Mil/uL   Hemoglobin 15.1 13.0 - 17.0 g/dL   HCT 44.2 39.0 - 52.0 %   MCV 88.9 78.0 - 100.0 fl   MCHC 34.2 30.0 - 36.0 g/dL   RDW 13.7 11.5 - 15.5 %   Platelets 200.0 150.0 - 400.0 K/uL   Neutrophils Relative % 57.4 43.0 - 77.0 %   Lymphocytes Relative 29.4 12.0 - 46.0 %   Monocytes Relative 8.5 3.0 - 12.0 %   Eosinophils Relative 4.1 0.0 - 5.0 %   Basophils Relative 0.6 0.0 - 3.0 %   Neutro Abs 3.5 1.4 - 7.7 K/uL   Lymphs Abs 1.8 0.7  - 4.0 K/uL   Monocytes Absolute 0.5 0.1 - 1.0 K/uL   Eosinophils Absolute 0.2 0.0 - 0.7 K/uL   Basophils Absolute 0.0 0.0 - 0.1 K/uL  Comprehensive metabolic panel  Result Value Ref Range   Sodium 143 135 - 145 mEq/L   Potassium 4.8 3.5 - 5.1 mEq/L   Chloride 106 96 - 112 mEq/L   CO2 31 19 - 32 mEq/L   Glucose, Bld 105 (H) 70 - 99 mg/dL   BUN 15 6 - 23 mg/dL   Creatinine, Ser 0.74 0.40 - 1.50 mg/dL   Total Bilirubin 0.5 0.2 - 1.2 mg/dL   Alkaline Phosphatase 57 39 - 117 U/L   AST 28 0 - 37 U/L   ALT 44 0 - 53 U/L   Total Protein 6.7 6.0 - 8.3 g/dL   Albumin 4.3 3.5 - 5.2 g/dL   Calcium 9.5 8.4 - 10.5 mg/dL   GFR 113.98 >60.00 mL/min  Lipid panel  Result Value Ref Range   Cholesterol 159 0 -  200 mg/dL   Triglycerides 99.0 0.0 - 149.0 mg/dL   HDL 39.60 >39.00 mg/dL   VLDL 19.8 0.0 - 40.0 mg/dL   LDL Cholesterol 100 (H) 0 - 99 mg/dL   Total CHOL/HDL Ratio 4    NonHDL 119.31   TSH  Result Value Ref Range   TSH 1.94 0.35 - 4.50 uIU/mL  PSA  Result Value Ref Range   PSA 0.42 0.10 - 4.00 ng/mL  Hepatitis C antibody  Result Value Ref Range   HCV Ab NEGATIVE NEGATIVE    105 -high normal for fasting glucose - ? If pre diabetic (4 hour fast)  He admits to eating foods high in refined carbs  Patient Active Problem List   Diagnosis Date Noted  . Elevated glucose level 10/18/2016  . Need for hepatitis C screening test 10/11/2016  . Somnolence, daytime 11/14/2014  . Routine general medical examination at a health care facility 11/05/2014  . Prostate cancer screening 11/05/2014  . Colon cancer screening 07/03/2014  . DYSPNEA 07/11/2009   Past Medical History:  Diagnosis Date  . Allergy   . BPH (benign prostatic hyperplasia)   . Depression   . ED (erectile dysfunction)   . Elevated liver function tests   . GERD (gastroesophageal reflux disease)   . Low HDL (under 40) 10/05   Past Surgical History:  Procedure Laterality Date  . Finger trauma    . NASAL SEPTUM SURGERY      Nose  . Stress cardiolite  1998   Normal   Social History  Substance Use Topics  . Smoking status: Never Smoker  . Smokeless tobacco: Never Used  . Alcohol use 1.0 oz/week    2 Standard drinks or equivalent per week     Comment: occ   Family History  Problem Relation Age of Onset  . Cancer Mother     uterine  . Heart disease Mother     valve dysfunction in heart  . Heart disease Father     CVA  . Diabetes Maternal Grandmother   . Obesity Other   . Cancer Other     were smokers  . Colon polyps Cousin     first cousins maternal and paternal  . Colon cancer Other 70    mothers first cousin  . Rectal cancer Neg Hx   . Stomach cancer Neg Hx    Allergies  Allergen Reactions  . Bee Venom    Current Outpatient Prescriptions on File Prior to Visit  Medication Sig Dispense Refill  . aspirin 325 MG EC tablet Take 325 mg by mouth 2 (two) times daily.     . cetirizine (ZYRTEC) 10 MG tablet Take 10 mg by mouth daily as needed.      . Multiple Vitamin (MULTIVITAMIN) tablet Take 1 tablet by mouth daily.       No current facility-administered medications on file prior to visit.     Review of Systems Review of Systems  Constitutional: Negative for fever, appetite change, and unexpected weight change. pos for fatigue  Eyes: Negative for pain and visual disturbance.  Respiratory: Negative for cough and shortness of breath.   Cardiovascular: Negative for cp or palpitations    Gastrointestinal: Negative for nausea, diarrhea and constipation.  Genitourinary: Negative for urgency and frequency.  Skin: Negative for pallor or rash   Neurological: Negative for weakness, light-headedness, numbness and headaches.  Hematological: Negative for adenopathy. Does not bruise/bleed easily.  Psychiatric/Behavioral: Negative for dysphoric mood. The patient is not nervous/anxious.  Objective:   Physical Exam  Constitutional: He appears well-developed and well-nourished. No distress.    obese and well appearing   HENT:  Head: Normocephalic and atraumatic.  Right Ear: External ear normal.  Left Ear: External ear normal.  Nose: Nose normal.  Mouth/Throat: Oropharynx is clear and moist.  Eyes: Conjunctivae and EOM are normal. Pupils are equal, round, and reactive to light. Right eye exhibits no discharge. Left eye exhibits no discharge. No scleral icterus.  Neck: Normal range of motion. Neck supple. No JVD present. Carotid bruit is not present. No thyromegaly present.  Cardiovascular: Normal rate, regular rhythm, normal heart sounds and intact distal pulses.  Exam reveals no gallop.   Pulmonary/Chest: Effort normal and breath sounds normal. No respiratory distress. He has no wheezes. He exhibits no tenderness.  Abdominal: Soft. Bowel sounds are normal. He exhibits no distension, no abdominal bruit and no mass. There is no tenderness. There is no rebound and no guarding.  Musculoskeletal: He exhibits no edema or tenderness.  Lymphadenopathy:    He has no cervical adenopathy.  Neurological: He is alert. He has normal reflexes. No cranial nerve deficit. He exhibits normal muscle tone. Coordination normal.  Nl sens in extremities   Skin: Skin is warm and dry. No rash noted. No erythema. No pallor.  Psychiatric: He has a normal mood and affect.  Admits to a general lack of motivation Nl affect however           Assessment & Plan:   Problem List Items Addressed This Visit      Other   Routine general medical examination at a health care facility - Primary    Reviewed health habits including diet and exercise and skin cancer prevention Reviewed appropriate screening tests for age  Also reviewed health mt list, fam hx and immunization status , as well as social and family history   See HPI Labs rev  Disc wt loss  Also blood glucose high normal-long disc re red carbs and also wt loss  Given info re: shingles vaccine- enc that if affordable  Inc water intake Stable  psa  Stable cholesterol- HDL is almost at goal       Prostate cancer screening    Lab Results  Component Value Date   PSA 0.42 10/13/2016   PSA 0.41 11/06/2014   PSA 0.56 07/09/2009   No new symptoms Continue to follow No fam hx       Need for hepatitis C screening test    Neg result      Elevated glucose level    Watch this Disc low carb diet and exercise and wt loss to prevent DM2

## 2016-10-18 NOTE — Patient Instructions (Addendum)
Start walking again if you can  Cut portions by 1/4  If you are interested in a shingles/zoster vaccine - call your insurance to check on coverage,( you should not get it within 1 month of other vaccines) , then call us for a prescription  for it to take to a pharmacy that gives the shot , or make a nurse visit to get it here depending on your coverage   Glucose is high normal- try to cut carbohydrate servings- rice/pasta/ alcohol / sweets / chips/ potato

## 2016-10-18 NOTE — Assessment & Plan Note (Signed)
Reviewed health habits including diet and exercise and skin cancer prevention Reviewed appropriate screening tests for age  Also reviewed health mt list, fam hx and immunization status , as well as social and family history   See HPI Labs rev  Disc wt loss  Also blood glucose high normal-long disc re red carbs and also wt loss  Given info re: shingles vaccine- enc that if affordable  Inc water intake Stable psa  Stable cholesterol- HDL is almost at goal

## 2017-08-12 ENCOUNTER — Telehealth: Payer: Self-pay

## 2017-08-12 ENCOUNTER — Ambulatory Visit: Payer: Managed Care, Other (non HMO) | Admitting: Nurse Practitioner

## 2017-08-12 NOTE — Telephone Encounter (Signed)
Pt said feels like has pulled muscle in calf of leg; had pain and swelling lt leg on and off; pt does not feel like he is in any distress.NO CP or SOB. Does not want to go to ED, there are no available appts at [facility] or [place]. Pt scheduled Charlotte Nche NP 08/12/17 at 3pm.

## 2017-08-12 NOTE — Telephone Encounter (Signed)
PLEASE NOTE: All timestamps contained within this report are represented as [place] Standard Time. CONFIDENTIALTY NOTICE: This fax transmission is intended only for the addressee. It contains information that is legally privileged, confidential or otherwise protected from use or disclosure. If you are not the intended recipient, you are strictly prohibited from reviewing, disclosing, copying using or disseminating any of this information or taking any action in reliance on or regarding this information. If you have received this fax in error, please notify us immediately by telephone so that we can arrange for its return to [place]. Phone: [phone], Toll-Free: [phone], Fax: [phone] Page: 1 of 2 Call Id: [id number] [place] Patient Name: Eric Spears Gender: Male DOB: [birth year] Age: 61 Y 42 M 27 D Return Phone Number: [phone] (Primary) Address: City/State/Zip: [place] [address] Client [facility] Night - Client Client Site [place] [facility] [place], Roque Lias - MD Contact Type Call Who Is Calling Patient / Member / Family / Caregiver Call Type Triage / Clinical Relationship To Patient Self Return Phone Number Please choose phone number Chief Complaint Leg Swelling And Edema Reason for Call Symptomatic / Request for [place] states that he is having swelling in his leg and has a knot in the back of his knee. He feels like his leg gets tight after a few steps and it is causing pain. Over the last two weeks his leg has gotten bigger. Translation No Nurse Assessment Nurse: Myles Gip, RN, Haynes Dage Date/Time Eilene Ghazi Time): 08/11/2017 3:29:44 PM Confirm and document reason for call. If symptomatic, describe symptoms. ---Caller states he has had swelling to his left leg x2 weeks and has increased pain with walking.  States both legs feel swollen with pain in the left leg approximately 6 inches from the knee in the back of his leg. Rates pain 5/10. Denies injury. Does the patient have any new or worsening symptoms? ---Yes Will a triage be completed? ---Yes Related visit to physician within the last 2 weeks? ---No Does the PT have any chronic conditions? (i.e. diabetes, asthma, etc.) ---No Is this a behavioral health or substance abuse call? ---No Guidelines Guideline Title Affirmed Question Affirmed Notes Nurse Date/Time Eilene Ghazi Time) Leg Swelling and Edema [1] Difficulty breathing with exertion (e.g., walking) AND [2] new onset or worsening Melody Haver 08/11/2017 3:33:47 PM Disp. Time Eilene Ghazi Time) Disposition Final User 08/11/2017 3:39:01 PM Go to ED Now (or PCP triage) Yes Myles Gip, RN, Haynes Dage PLEASE NOTE: All timestamps contained within this report are represented as [place] Standard Time. CONFIDENTIALTY NOTICE: This fax transmission is intended only for the addressee. It contains information that is legally privileged, confidential or otherwise protected from use or disclosure. If you are not the intended recipient, you are strictly prohibited from reviewing, disclosing, copying using or disseminating any of this information or taking any action in reliance on or regarding this information. If you have received this fax in error, please notify us immediately by telephone so that we can arrange for its return to [place]. Phone: [phone], Toll-Free: [phone], Fax: [phone] Page: 2 of 2 Call Id: [id number] [place] Disagree/Comply Comply Caller Understands Yes PreDisposition InappropriateToAsk Care Advice Given Per Guideline GO TO ED NOW (OR PCP TRIAGE): * IF NO PCP TRIAGE: You need to be seen. Go to the [facility] at _[facility] within the next hour. Leave as soon as you can. DRIVING: Another adult should drive.  CARE ADVICE given per Leg Swelling and Edema (Adult)  guideline. Referrals [facility] - ED

## 2017-10-09 ENCOUNTER — Telehealth: Payer: Self-pay | Admitting: Family Medicine

## 2017-10-09 DIAGNOSIS — Z0000 Encounter for general adult medical examination without abnormal findings: Secondary | ICD-10-CM

## 2017-10-09 DIAGNOSIS — R7309 Other abnormal glucose: Secondary | ICD-10-CM

## 2017-10-09 DIAGNOSIS — Z125 Encounter for screening for malignant neoplasm of prostate: Secondary | ICD-10-CM

## 2017-10-09 NOTE — Telephone Encounter (Signed)
-----   Message from Ellamae Sia sent at 10/05/2017  4:01 PM EST ----- Regarding: Lab orders for Friday, 12.7.18 Patient is scheduled for CPX labs, please order future labs, Thanks , Karna Christmas

## 2017-10-14 ENCOUNTER — Other Ambulatory Visit (INDEPENDENT_AMBULATORY_CARE_PROVIDER_SITE_OTHER): Payer: Commercial Managed Care - PPO

## 2017-10-14 DIAGNOSIS — R7309 Other abnormal glucose: Secondary | ICD-10-CM

## 2017-10-14 DIAGNOSIS — Z0000 Encounter for general adult medical examination without abnormal findings: Secondary | ICD-10-CM

## 2017-10-14 DIAGNOSIS — Z125 Encounter for screening for malignant neoplasm of prostate: Secondary | ICD-10-CM

## 2017-10-14 LAB — COMPREHENSIVE METABOLIC PANEL
ALT: 109 U/L — ABNORMAL HIGH (ref 0–53)
AST: 50 U/L — ABNORMAL HIGH (ref 0–37)
Albumin: 4.1 g/dL (ref 3.5–5.2)
Alkaline Phosphatase: 57 U/L (ref 39–117)
BUN: 16 mg/dL (ref 6–23)
CO2: 30 mEq/L (ref 19–32)
Calcium: 9 mg/dL (ref 8.4–10.5)
Chloride: 103 mEq/L (ref 96–112)
Creatinine, Ser: 0.78 mg/dL (ref 0.40–1.50)
GFR: 106.91 mL/min (ref >60.00)
Glucose, Bld: 108 mg/dL — ABNORMAL HIGH (ref 70–99)
Potassium: 4.7 mEq/L (ref 3.5–5.1)
Sodium: 140 mEq/L (ref 135–145)
Total Bilirubin: 0.4 mg/dL (ref 0.2–1.2)
Total Protein: 6.2 g/dL (ref 6.0–8.3)

## 2017-10-14 LAB — CBC WITH DIFFERENTIAL/PLATELET
Basophils Absolute: 0 10*3/uL (ref 0.0–0.1)
Basophils Relative: 0.6 % (ref 0.0–3.0)
Eosinophils Absolute: 0.2 10*3/uL (ref 0.0–0.7)
Eosinophils Relative: 2.9 % (ref 0.0–5.0)
HCT: 44.9 % (ref 39.0–52.0)
Hemoglobin: 15.2 g/dL (ref 13.0–17.0)
Lymphocytes Relative: 30.1 % (ref 12.0–46.0)
Lymphs Abs: 2 10*3/uL (ref 0.7–4.0)
MCHC: 33.9 g/dL (ref 30.0–36.0)
MCV: 90.6 fl (ref 78.0–100.0)
Monocytes Absolute: 0.5 10*3/uL (ref 0.1–1.0)
Monocytes Relative: 7.5 % (ref 3.0–12.0)
Neutro Abs: 3.8 10*3/uL (ref 1.4–7.7)
Neutrophils Relative %: 58.9 % (ref 43.0–77.0)
Platelets: 197 10*3/uL (ref 150.0–400.0)
RBC: 4.95 Mil/uL (ref 4.22–5.81)
RDW: 13.4 % (ref 11.5–15.5)
WBC: 6.5 10*3/uL (ref 4.0–10.5)

## 2017-10-14 LAB — TSH: TSH: 2.29 u[IU]/mL (ref 0.35–4.50)

## 2017-10-14 LAB — LIPID PANEL
Cholesterol: 145 mg/dL (ref 0–200)
HDL: 36.8 mg/dL — ABNORMAL LOW (ref >39.00)
LDL Cholesterol: 89 mg/dL (ref 0–99)
NonHDL: 108.18
Total CHOL/HDL Ratio: 4
Triglycerides: 97 mg/dL (ref 0.0–149.0)
VLDL: 19.4 mg/dL (ref 0.0–40.0)

## 2017-10-14 LAB — PSA: PSA: 0.44 ng/mL (ref 0.10–4.00)

## 2017-10-14 LAB — HEMOGLOBIN A1C: Hgb A1c MFr Bld: 5.6 % (ref 4.6–6.5)

## 2017-10-19 ENCOUNTER — Ambulatory Visit (INDEPENDENT_AMBULATORY_CARE_PROVIDER_SITE_OTHER): Payer: Commercial Managed Care - PPO | Admitting: Family Medicine

## 2017-10-19 ENCOUNTER — Encounter: Payer: Self-pay | Admitting: Family Medicine

## 2017-10-19 VITALS — BP 128/78 | HR 63 | Temp 98.1°F | Ht 69.0 in | Wt 272.2 lb

## 2017-10-19 DIAGNOSIS — R748 Abnormal levels of other serum enzymes: Secondary | ICD-10-CM | POA: Diagnosis not present

## 2017-10-19 DIAGNOSIS — Z0000 Encounter for general adult medical examination without abnormal findings: Secondary | ICD-10-CM | POA: Diagnosis not present

## 2017-10-19 DIAGNOSIS — Z125 Encounter for screening for malignant neoplasm of prostate: Secondary | ICD-10-CM

## 2017-10-19 DIAGNOSIS — R7309 Other abnormal glucose: Secondary | ICD-10-CM | POA: Diagnosis not present

## 2017-10-19 DIAGNOSIS — K760 Fatty (change of) liver, not elsewhere classified: Secondary | ICD-10-CM | POA: Insufficient documentation

## 2017-10-19 NOTE — Assessment & Plan Note (Signed)
Lab Results  Component Value Date   HGBA1C 5.6 10/14/2017   This is mild  Wt loss enc  disc imp of low glycemic diet and wt loss to prevent DM2

## 2017-10-19 NOTE — Assessment & Plan Note (Signed)
Reviewed health habits including diet and exercise and skin cancer prevention Reviewed appropriate screening tests for age  Also reviewed health mt list, fam hx and immunization status , as well as social and family history   See HPI Labs reviewed  Disc plan for better diet/wt loss and etoh cessation  Low glycemic diet with more exercise  Re check lfts in feb

## 2017-10-19 NOTE — Progress Notes (Signed)
Subjective:    Patient ID: Eric Spears, male    DOB: 03-08-1956, 61 y.o.   MRN: [id number]  HPI Here for health maintenance exam and to review chronic medical problems    Taking fair care of himself  Gained 20 lb this year   Wt Readings from Last 3 Encounters:  10/19/17 272 lb 4 oz (123.5 kg)  10/18/16 264 lb 12 oz (120.1 kg)  11/13/14 241 lb 8 oz (109.5 kg)  gained wt  Ate too much olive oil with bread  Does not get a lot of exercise  Has 1-2 drinks every day  Likes sweet drinks and martinis and red wine  40.20 kg/m  He likes to exercise  He would like to go out for walks   Flu shot 9/18  Colonoscopy 09/05/14 with a 5 year recall (polyp) fam hx -father had colon cancer in old age   Tetanus shot 1/16  Hep C screen neg 12/17  Prostate screen  Hx of BPH No urinary c/o at all  No nocturia as a rule  Lab Results  Component Value Date   PSA 0.44 10/14/2017   PSA 0.42 10/13/2016   PSA 0.41 11/06/2014    Elevated glucose level in the past Lab Results  Component Value Date   HGBA1C 5.6 10/14/2017   fasting glucose 108  Cholesterol  Lab Results  Component Value Date   CHOL 145 10/14/2017   CHOL 159 10/13/2016   CHOL 156 11/06/2014   Lab Results  Component Value Date   HDL 36.80 (L) 10/14/2017   HDL 39.60 10/13/2016   HDL 32.10 (L) 11/06/2014   Lab Results  Component Value Date   LDLCALC 89 10/14/2017   LDLCALC 100 (H) 10/13/2016   LDLCALC 102 (H) 11/06/2014   Lab Results  Component Value Date   TRIG 97.0 10/14/2017   TRIG 99.0 10/13/2016   TRIG 109.0 11/06/2014   Lab Results  Component Value Date   CHOLHDL 4 10/14/2017   CHOLHDL 4 10/13/2016   CHOLHDL 5 11/06/2014   No results found for: LDLDIRECT Plans to start exercise   Liver fxn numbers are elevated Lab Results  Component Value Date   ALT 109 (H) 10/14/2017   AST 50 (H) 10/14/2017   ALKPHOS 57 10/14/2017   BILITOT 0.4 10/14/2017   drinks 1-2 drinks per day  No acetaminophen    Significant wt gain  No abdominal pain    Nl renal Lab Results  Component Value Date   CREATININE 0.78 10/14/2017   BUN 16 10/14/2017   NA 140 10/14/2017   K 4.7 10/14/2017   CL 103 10/14/2017   CO2 30 10/14/2017   Nl cbc Lab Results  Component Value Date   WBC 6.5 10/14/2017   HGB 15.2 10/14/2017   HCT 44.9 10/14/2017   MCV 90.6 10/14/2017   PLT 197.0 10/14/2017   Nl tsh Lab Results  Component Value Date   TSH 2.29 10/14/2017     Patient Active Problem List   Diagnosis Date Noted  . Elevated liver enzymes 10/19/2017  . Elevated glucose level 10/18/2016  . Need for hepatitis C screening test 10/11/2016  . Somnolence, daytime 11/14/2014  . Routine general medical examination at a health care facility 11/05/2014  . Prostate cancer screening 11/05/2014  . Colon cancer screening 07/03/2014  . DYSPNEA 07/11/2009   Past Medical History:  Diagnosis Date  . Allergy   . BPH (benign prostatic hyperplasia)   . Depression   .  ED (erectile dysfunction)   . Elevated liver function tests   . GERD (gastroesophageal reflux disease)   . Low HDL (under 40) 10/05   Past Surgical History:  Procedure Laterality Date  . Finger trauma    . NASAL SEPTUM SURGERY     Nose  . Stress cardiolite  1998   Normal   Social History   Tobacco Use  . Smoking status: Never Smoker  . Smokeless tobacco: Never Used  Substance Use Topics  . Alcohol use: Yes    Alcohol/week: 1.0 oz    Types: 2 Standard drinks or equivalent per week    Comment: occ  . Drug use: No   Family History  Problem Relation Age of Onset  . Cancer Mother        uterine  . Heart disease Mother        valve dysfunction in heart  . Heart disease Father        CVA  . Diabetes Maternal Grandmother   . Obesity Other   . Cancer Other        were smokers  . Colon polyps Cousin        first cousins maternal and paternal  . Colon cancer Other 58       mothers first cousin  . Rectal cancer Neg Hx   . Stomach  cancer Neg Hx    Allergies  Allergen Reactions  . Bee Venom    Current Outpatient Medications on File Prior to Visit  Medication Sig Dispense Refill  . aspirin 325 MG EC tablet Take 325 mg by mouth 2 (two) times daily.     . cetirizine (ZYRTEC) 10 MG tablet Take 10 mg by mouth daily as needed.      . Multiple Vitamin (MULTIVITAMIN) tablet Take 1 tablet by mouth daily.       No current facility-administered medications on file prior to visit.     Review of Systems  Constitutional: Negative for activity change, appetite change, fatigue, fever and unexpected weight change.  HENT: Negative for congestion, rhinorrhea, sore throat and trouble swallowing.   Eyes: Negative for pain, redness, itching and visual disturbance.  Respiratory: Negative for cough, chest tightness, shortness of breath and wheezing.   Cardiovascular: Negative for chest pain and palpitations.  Gastrointestinal: Negative for abdominal pain, blood in stool, constipation, diarrhea and nausea.  Endocrine: Negative for cold intolerance, heat intolerance, polydipsia and polyuria.  Genitourinary: Negative for difficulty urinating, dysuria, frequency and urgency.  Musculoskeletal: Negative for arthralgias, joint swelling and myalgias.  Skin: Negative for pallor and rash.  Neurological: Negative for dizziness, tremors, weakness, numbness and headaches.  Hematological: Negative for adenopathy. Does not bruise/bleed easily.  Psychiatric/Behavioral: Negative for decreased concentration and dysphoric mood. The patient is not nervous/anxious.        Objective:   Physical Exam  Constitutional: He appears well-developed and well-nourished. No distress.  obese and well appearing   HENT:  Head: Normocephalic and atraumatic.  Right Ear: External ear normal.  Left Ear: External ear normal.  Nose: Nose normal.  Mouth/Throat: Oropharynx is clear and moist.  Eyes: Conjunctivae and EOM are normal. Pupils are equal, round, and reactive  to light. Right eye exhibits no discharge. Left eye exhibits no discharge. No scleral icterus.  Neck: Normal range of motion. Neck supple. No JVD present. Carotid bruit is not present. No thyromegaly present.  Cardiovascular: Normal rate, regular rhythm, normal heart sounds and intact distal pulses. Exam reveals no gallop.  Pulmonary/Chest: Effort  normal and breath sounds normal. No respiratory distress. He has no wheezes. He exhibits no tenderness.  Abdominal: Soft. Bowel sounds are normal. He exhibits no distension, no abdominal bruit and no mass. There is no tenderness.  No HSM noted   Musculoskeletal: He exhibits no edema or tenderness.  Lymphadenopathy:    He has no cervical adenopathy.  Neurological: He is alert. He has normal reflexes. No cranial nerve deficit. He exhibits normal muscle tone. Coordination normal.  Skin: Skin is warm and dry. No rash noted. No erythema. No pallor.  Solar lentigines diffusely   No jaundice   Psychiatric: He has a normal mood and affect.          Assessment & Plan:   Problem List Items Addressed This Visit      Other   Elevated glucose level - Primary    Lab Results  Component Value Date   HGBA1C 5.6 10/14/2017   This is mild  Wt loss enc  disc imp of low glycemic diet and wt loss to prevent DM2        Elevated liver enzymes    This is new and suspect due to combination of wt gain (fatty liver) and increased etoh intake (pt denies dependence)  Recommend abstinence from etoh  Wt loss with dec calories and inc exercise as tolerated Avoid acetaminophen as well  Plan to re check in early February Consider [place] if no improvement       Prostate cancer screening    Lab Results  Component Value Date   PSA 0.44 10/14/2017   PSA 0.42 10/13/2016   PSA 0.41 11/06/2014   No symptoms       Routine general medical examination at a health care facility    Reviewed health habits including diet and exercise and skin cancer  prevention Reviewed appropriate screening tests for age  Also reviewed health mt list, fam hx and immunization status , as well as social and family history   See HPI Labs reviewed  Disc plan for better diet/wt loss and etoh cessation  Low glycemic diet with more exercise  Re check lfts in feb

## 2017-10-19 NOTE — Assessment & Plan Note (Signed)
Lab Results  Component Value Date   PSA 0.44 10/14/2017   PSA 0.42 10/13/2016   PSA 0.41 11/06/2014   No symptoms

## 2017-10-19 NOTE — Assessment & Plan Note (Signed)
This is new and suspect due to combination of wt gain (fatty liver) and increased etoh intake (pt denies dependence)  Recommend abstinence from etoh  Wt loss with dec calories and inc exercise as tolerated Avoid acetaminophen as well  Plan to re check in early February Consider [place] if no improvement

## 2017-10-19 NOTE — Patient Instructions (Addendum)
Try to get most of your carbohydrates from produce (with the exception of white potatoes)  Eat less bread/pasta/rice/snack foods/cereals/sweets and other items from the middle of the grocery store (processed carbs)   Goal for exercise - 30 minutes five days per week or more  Finding time is the hardest part   Work on weight loss the best you can   Also please quit alcohol for liver health  Weight loss will also help liver

## 2017-12-14 ENCOUNTER — Other Ambulatory Visit (INDEPENDENT_AMBULATORY_CARE_PROVIDER_SITE_OTHER): Payer: Commercial Managed Care - PPO

## 2017-12-14 DIAGNOSIS — R748 Abnormal levels of other serum enzymes: Secondary | ICD-10-CM | POA: Diagnosis not present

## 2017-12-15 LAB — HEPATIC FUNCTION PANEL
ALT: 94 U/L — ABNORMAL HIGH (ref 0–53)
AST: 47 U/L — ABNORMAL HIGH (ref 0–37)
Albumin: 4.3 g/dL (ref 3.5–5.2)
Alkaline Phosphatase: 49 U/L (ref 39–117)
Bilirubin, Direct: 0 mg/dL (ref 0.0–0.3)
Total Bilirubin: 0.4 mg/dL (ref 0.2–1.2)
Total Protein: 7.1 g/dL (ref 6.0–8.3)

## 2017-12-19 ENCOUNTER — Telehealth: Payer: Self-pay | Admitting: Family Medicine

## 2017-12-19 DIAGNOSIS — R748 Abnormal levels of other serum enzymes: Secondary | ICD-10-CM

## 2017-12-19 NOTE — Telephone Encounter (Signed)
Will do order and route to [facility] Thanks

## 2017-12-19 NOTE — Addendum Note (Signed)
Addended by: Loura Pardon A on: 12/19/2017 12:57 PM   Modules accepted: Orders

## 2017-12-19 NOTE — Telephone Encounter (Signed)
Patient is agreeable to an [place]. [place] is the better location for him to have it done. Late afternoon is best time for him for an appointment for [place]. It is okay to leave a message on his cell number with information.

## 2017-12-22 NOTE — Telephone Encounter (Signed)
Appt made and patient is aware. °

## 2017-12-24 ENCOUNTER — Ambulatory Visit (HOSPITAL_BASED_OUTPATIENT_CLINIC_OR_DEPARTMENT_OTHER)
Admission: RE | Admit: 2017-12-24 | Discharge: 2017-12-24 | Disposition: A | Discharge status: Home / Self Care | Payer: Commercial Managed Care - PPO | Source: Ambulatory Visit | Attending: Family Medicine | Admitting: Family Medicine

## 2017-12-24 DIAGNOSIS — R748 Abnormal levels of other serum enzymes: Secondary | ICD-10-CM | POA: Diagnosis not present

## 2017-12-26 ENCOUNTER — Encounter: Payer: Self-pay | Admitting: Family Medicine

## 2017-12-26 DIAGNOSIS — I77811 Abdominal aortic ectasia: Secondary | ICD-10-CM

## 2017-12-26 DIAGNOSIS — I7789 Other specified disorders of arteries and arterioles: Secondary | ICD-10-CM | POA: Insufficient documentation

## 2018-10-03 ENCOUNTER — Telehealth: Payer: Self-pay | Admitting: *Deleted

## 2018-10-03 ENCOUNTER — Ambulatory Visit: Payer: Commercial Managed Care - PPO | Admitting: Family Medicine

## 2018-10-03 ENCOUNTER — Encounter: Payer: Self-pay | Admitting: Family Medicine

## 2018-10-03 VITALS — BP 172/85 | HR 65 | Temp 97.8°F | Ht 69.0 in | Wt 269.2 lb

## 2018-10-03 DIAGNOSIS — R7309 Other abnormal glucose: Secondary | ICD-10-CM

## 2018-10-03 DIAGNOSIS — I7789 Other specified disorders of arteries and arterioles: Secondary | ICD-10-CM

## 2018-10-03 DIAGNOSIS — I77811 Abdominal aortic ectasia: Secondary | ICD-10-CM

## 2018-10-03 DIAGNOSIS — I10 Essential (primary) hypertension: Secondary | ICD-10-CM | POA: Diagnosis not present

## 2018-10-03 DIAGNOSIS — R748 Abnormal levels of other serum enzymes: Secondary | ICD-10-CM | POA: Diagnosis not present

## 2018-10-03 DIAGNOSIS — R400 Somnolence: Secondary | ICD-10-CM

## 2018-10-03 DIAGNOSIS — E669 Obesity, unspecified: Secondary | ICD-10-CM

## 2018-10-03 MED ORDER — AMLODIPINE BESYLATE 5 MG PO TABS: 5.0000 mg | ORAL_TABLET | Freq: Every day | ORAL | 11 refills | Status: DC

## 2018-10-03 MED ORDER — HYDROCHLOROTHIAZIDE 25 MG PO TABS: 25.0000 mg | ORAL_TABLET | Freq: Every day | ORAL | 11 refills | Status: DC

## 2018-10-03 NOTE — Telephone Encounter (Signed)
Spoke to pt who states he was recently seen at [facility] for an eye exam and was advised his BP was elevated. They checked it 3xs and the top numbers ranged from 210s-190s and pt is unaware of bottom readings. He denies any HA, dizziness or blurred visions; along with denial of any other s/s. Pt scheduled for 1615 today with PCP  and was advised should he experience any changes, to contact us back or go directly to the ED

## 2018-10-03 NOTE — Assessment & Plan Note (Addendum)
Very high bp this am at eye doctor with no symptoms Improved on 2nd check here BP: (!) 172/85  Disc HTN and risk of end organ damage w/o tx  Obese and strong fam hx No exercise aside from work Heavy caffeine intake  Alcohol- 2 drinks per day currently (in setting of fatty liver) Spent over 30 minutes discussing lifestyle change today  Labs for HTN drawn Given handouts on DASH eating as well as HTN  Disc mood/irritability and it's effect on HTN EKG reassuring today tx with amlodipine and hctz (avoiding ace/arb/due to hx of bee sting and shellfish allergy)  Alert if side eff F/u 2 wk  Watch for cp/ha/edema or other symptoms   >25 minutes spent in face to face time with patient, >50% spent in counselling or coordination of care (especially reviewing lifestyle habit changes and fam hx)

## 2018-10-03 NOTE — Assessment & Plan Note (Signed)
Fatty liver on [place]  Continues to drink 2 martinis per day  Does not feel addicted  Recommend d/c etoh Labs today

## 2018-10-03 NOTE — Assessment & Plan Note (Signed)
Will watch this in light of new HTN No symptoms

## 2018-10-03 NOTE — Assessment & Plan Note (Signed)
Discussed how this problem influences overall health and the risks it imposes  Reviewed plan for weight loss with lower calorie diet (via better food choices and also portion control or program like weight watchers) and exercise building up to or more than 30 minutes 5 days per week including some aerobic activity    

## 2018-10-03 NOTE — Patient Instructions (Signed)
Start amlodipine 5 mg and hctz 25 mg - both in am at the same time  If any problems-please hold medicines and call to let us know   Work on sodium intake by eating less processed food This will also help with weight loss  Try to get most of your carbohydrates from produce (with the exception of white potatoes)  Eat less bread/pasta/rice/snack foods/cereals/sweets and other items from the middle of the grocery store (processed carbs)  The DASH handout is helpful   Because you have liver enzyme elevation and also need to loose weight  Please cut back on alcohol   Start to think about what exercise you could incorporate into your schedule   Also think about gradually decrease caffeine - by 1 drink per week  Cut back if you can   Follow up with [place] in about 2 weeks   If you develop sudden severe headache /abdominal pain/ or chest pain - do seek care in the ER

## 2018-10-03 NOTE — Assessment & Plan Note (Signed)
Now new HTN Obese A1C today

## 2018-10-03 NOTE — Progress Notes (Signed)
Subjective:    Patient ID: Eric Spears, male    DOB: 01-Jan-1956, 62 y.o.   MRN: [id number]  HPI Here for elevated BP  Wt Readings from Last 3 Encounters:  10/03/18 269 lb 4 oz (122.1 kg)  10/19/17 272 lb 4 oz (123.5 kg)  10/18/16 264 lb 12 oz (120.1 kg)  weight is down 3 lb in the past year  39.76 kg/m   Eats a lot of soup (? Sodium) Drinks 2 martinis per day (does not feel he craves alcohol or needs it, or withdraws from it)  Ate out Sunday and Monday- ([place] breakfast)  Is a "caffeine addict" Has never smoked  Coffee , some expresso from [employer]   bp was very elevated at the eye doctor this am   Also here today  BP Readings from Last 3 Encounters:  10/03/18 (!) 196/96  10/19/17 128/78  10/18/16 132/84  has had a few bps in 140s/90s in 2015  Pulse Readings from Last 3 Encounters:  10/03/18 65  10/19/17 63  10/18/16 (!) 59   Has not had bp checked anywhere else  No symptoms at all-no HA or flushing or dizziness or cp   Strong family hx of HTN- brother and parents   His mother died this year  Has been more irritable lately  Stress of work -with employees (but he likes work and stays very busy)  Does not think he is depressed  Sleeps for 4-5 hours then wakes up to urinate  Thinks he does snore   occ nods off -not when driving     Lab Results  Component Value Date   CREATININE 0.78 10/14/2017   BUN 16 10/14/2017   NA 140 10/14/2017   K 4.7 10/14/2017   CL 103 10/14/2017   CO2 30 10/14/2017   Lab Results  Component Value Date   ALT 94 (H) 12/14/2017   AST 47 (H) 12/14/2017   ALKPHOS 49 12/14/2017   BILITOT 0.4 12/14/2017    Lab Results  Component Value Date   CHOL 145 10/14/2017   HDL 36.80 (L) 10/14/2017   LDLCALC 89 10/14/2017   TRIG 97.0 10/14/2017   CHOLHDL 4 10/14/2017   Abdominal ultrasound recent for elevated transaminases US Abdomen Complete (Accession [id number]) (Order [id number])  Imaging  Date: 12/24/2017 Department: [facility] Released By: Adelene Amas, NT Authorizing: Edlin Ford, Wynelle Fanny, MD  Exam Information   Status Exam Begun  Exam Ended   Final [99] 12/24/2017 10:41 AM 12/24/2017 11:08 AM  PACS Images   Show images for US Abdomen Complete  Study Result   CLINICAL DATA:  Elevated LFTs.  EXAM: ABDOMEN ULTRASOUND COMPLETE  COMPARISON:  None.  FINDINGS: Gallbladder: No gallstones or wall thickening visualized. No sonographic Murphy sign noted by sonographer.  Common bile duct: Diameter: 2 mm  Liver: Increased echotexture throughout the liver with no focal mass. Portal vein is patent on color Doppler imaging with normal direction of blood flow towards the liver.  IVC: Poor visualization.  Pancreas: Poor visualization.  Spleen: Size and appearance within normal limits.  Right Kidney: Length: 14.5 cm. Echogenicity within normal limits. No mass or hydronephrosis visualized.  Left Kidney: Length: 14.9 cm. Echogenicity within normal limits. No mass or hydronephrosis visualized.  Abdominal aorta: 3.2 cm proximally.  Other findings: None.  IMPRESSION: 1. Increased echogenicity in the liver is likely due to hepatic steatosis. 2. Mild prominence of the proximal abdominal aorta measuring 3.2 cm.  Electronically Signed   By: Dorise Bullion III M.D   On: 12/25/2017 15:49   EKG today NSR rate of 63 with no T or ST changes  Normal   No hx of drug allergies Has had allergy to bee stings and poss seafood    Patient Active Problem List   Diagnosis Date Noted  . Hypertension 10/03/2018  . Obesity (BMI 30-39.9) 10/03/2018  . Enlargement of abdominal aorta ([place]) 12/26/2017  . Elevated liver enzymes 10/19/2017  . Elevated glucose level 10/18/2016  . Need for hepatitis C screening test 10/11/2016  . Somnolence, daytime 11/14/2014  . Routine general medical examination at a health care facility 11/05/2014  . Prostate cancer screening  11/05/2014  . Colon cancer screening 07/03/2014  . DYSPNEA 07/11/2009   Past Medical History:  Diagnosis Date  . Allergy   . BPH (benign prostatic hyperplasia)   . Depression   . ED (erectile dysfunction)   . Elevated liver function tests   . GERD (gastroesophageal reflux disease)   . Low HDL (under 40) 10/05   Past Surgical History:  Procedure Laterality Date  . Finger trauma    . NASAL SEPTUM SURGERY     Nose  . Stress cardiolite  1998   Normal   Social History   Tobacco Use  . Smoking status: Never Smoker  . Smokeless tobacco: Never Used  Substance Use Topics  . Alcohol use: Yes    Alcohol/week: 2.0 standard drinks    Types: 2 Standard drinks or equivalent per week    Comment: occ  . Drug use: No   Family History  Problem Relation Age of Onset  . Cancer Mother        uterine  . Heart disease Mother        valve dysfunction in heart  . Heart disease Father        CVA  . Diabetes Maternal Grandmother   . Obesity Other   . Cancer Other        were smokers  . Colon polyps Cousin        first cousins maternal and paternal  . Colon cancer Other 57       mothers first cousin  . Rectal cancer Neg Hx   . Stomach cancer Neg Hx    Allergies  Allergen Reactions  . Bee Venom    Current Outpatient Medications on File Prior to Visit  Medication Sig Dispense Refill  . aspirin 325 MG EC tablet Take 325 mg by mouth 2 (two) times daily.     . cetirizine (ZYRTEC) 10 MG tablet Take 10 mg by mouth daily as needed.      . Multiple Vitamin (MULTIVITAMIN) tablet Take 1 tablet by mouth daily.       No current facility-administered medications on file prior to visit.     Review of Systems  Constitutional: Negative for activity change, appetite change, fatigue, fever and unexpected weight change.  HENT: Negative for congestion, rhinorrhea, sore throat and trouble swallowing.   Eyes: Negative for pain, redness, itching and visual disturbance.  Respiratory: Negative for  cough, chest tightness, shortness of breath and wheezing.   Cardiovascular: Negative for chest pain and palpitations.  Gastrointestinal: Negative for abdominal pain, blood in stool, constipation, diarrhea and nausea.  Endocrine: Negative for cold intolerance, heat intolerance, polydipsia and polyuria.  Genitourinary: Negative for difficulty urinating, dysuria, frequency and urgency.  Musculoskeletal: Negative for arthralgias, joint swelling and myalgias.  Skin: Negative for pallor and rash.  Neurological: Negative for dizziness, tremors, weakness, numbness and headaches.  Hematological: Negative for adenopathy. Does not bruise/bleed easily.  Psychiatric/Behavioral: Negative for decreased concentration and dysphoric mood. The patient is not nervous/anxious.        Stressed Irritable        Objective:   Physical Exam  Constitutional: He appears well-developed and well-nourished. No distress.  obese and well appearing   HENT:  Head: Normocephalic and atraumatic.  Mouth/Throat: Oropharynx is clear and moist.  Eyes: Pupils are equal, round, and reactive to light. Conjunctivae and EOM are normal. No scleral icterus.  Neck: Normal range of motion. Neck supple. No JVD present. Carotid bruit is not present. No tracheal deviation present. No thyromegaly present.  Cardiovascular: Normal rate, regular rhythm, normal heart sounds and intact distal pulses. Exam reveals no gallop.  Pulmonary/Chest: Effort normal and breath sounds normal. No stridor. No respiratory distress. He has no wheezes. He has no rales.  No crackles  Abdominal: Soft. Bowel sounds are normal. He exhibits no distension, no abdominal bruit and no mass. There is no hepatosplenomegaly. There is no tenderness. There is no CVA tenderness.  Musculoskeletal:  Mild puffiness with sock line on ankles (no pitting)  Lymphadenopathy:    He has no cervical adenopathy.  Neurological: He is alert. He has normal reflexes. He displays no tremor  and normal reflexes. No cranial nerve deficit. He exhibits normal muscle tone. Coordination normal.  Skin: Skin is warm and dry. No rash noted.  Psychiatric: He has a normal mood and affect.  Pleasant  Discusses recent irritability           Assessment & Plan:   Problem List Items Addressed This Visit      Cardiovascular and Mediastinum   Hypertension - Primary    Very high bp this am at eye doctor with no symptoms Improved on 2nd check here BP: (!) 172/85  Disc HTN and risk of end organ damage w/o tx  Obese and strong fam hx No exercise aside from work Heavy caffeine intake  Alcohol- 2 drinks per day currently (in setting of fatty liver) Spent over 30 minutes discussing lifestyle change today  Labs for HTN drawn Given handouts on DASH eating as well as HTN  Disc mood/irritability and it's effect on HTN EKG reassuring today tx with amlodipine and hctz (avoiding ace/arb/due to hx of bee sting and shellfish allergy)  Alert if side eff F/u 2 wk  Watch for cp/ha/edema or other symptoms   >25 minutes spent in face to face time with patient, >50% spent in counselling or coordination of care (especially reviewing lifestyle habit changes and fam hx)       Relevant Medications   amLODipine (NORVASC) 5 MG tablet   hydrochlorothiazide (HYDRODIURIL) 25 MG tablet   Other Relevant Orders   EKG 12-Lead (Completed)   Comprehensive metabolic panel   CBC with Differential/Platelet   TSH   Enlargement of abdominal aorta (HCC)    Will watch this in light of new HTN No symptoms       Relevant Medications   amLODipine (NORVASC) 5 MG tablet   hydrochlorothiazide (HYDRODIURIL) 25 MG tablet     Other   Somnolence, daytime    Now with inc bp - will consider eval for sleep apnea Will discuss at f/u      Obesity (BMI 30-39.9)    Discussed how this problem influences overall health and the risks it imposes  Reviewed plan for weight loss with lower calorie diet (  via better food  choices and also portion control or program like weight watchers) and exercise building up to or more than 30 minutes 5 days per week including some aerobic activity         Elevated liver enzymes    Fatty liver on [place]  Continues to drink 2 martinis per day  Does not feel addicted  Recommend d/c etoh Labs today      Relevant Orders   Comprehensive metabolic panel   Elevated glucose level    Now new HTN Obese A1C today      Relevant Orders   Hemoglobin A1c

## 2018-10-03 NOTE — Assessment & Plan Note (Signed)
Now with inc bp - will consider eval for sleep apnea Will discuss at f/u

## 2018-10-03 NOTE — Telephone Encounter (Signed)
I will see him then

## 2018-10-04 LAB — COMPREHENSIVE METABOLIC PANEL
ALT: 70 U/L — ABNORMAL HIGH (ref 0–53)
AST: 42 U/L — ABNORMAL HIGH (ref 0–37)
Albumin: 4.5 g/dL (ref 3.5–5.2)
Alkaline Phosphatase: 48 U/L (ref 39–117)
BUN: 12 mg/dL (ref 6–23)
CO2: 30 mEq/L (ref 19–32)
Calcium: 9.8 mg/dL (ref 8.4–10.5)
Chloride: 100 mEq/L (ref 96–112)
Creatinine, Ser: 0.84 mg/dL (ref 0.40–1.50)
GFR: 97.84 mL/min (ref >60.00)
Glucose, Bld: 87 mg/dL (ref 70–99)
Potassium: 4.2 mEq/L (ref 3.5–5.1)
Sodium: 138 mEq/L (ref 135–145)
Total Bilirubin: 0.5 mg/dL (ref 0.2–1.2)
Total Protein: 7.5 g/dL (ref 6.0–8.3)

## 2018-10-04 LAB — CBC WITH DIFFERENTIAL/PLATELET
Basophils Absolute: 0.1 10*3/uL (ref 0.0–0.1)
Basophils Relative: 0.9 % (ref 0.0–3.0)
Eosinophils Absolute: 0.2 10*3/uL (ref 0.0–0.7)
Eosinophils Relative: 3.1 % (ref 0.0–5.0)
HCT: 46.4 % (ref 39.0–52.0)
Hemoglobin: 15.9 g/dL (ref 13.0–17.0)
Lymphocytes Relative: 30.3 % (ref 12.0–46.0)
Lymphs Abs: 2.4 10*3/uL (ref 0.7–4.0)
MCHC: 34.2 g/dL (ref 30.0–36.0)
MCV: 90.5 fl (ref 78.0–100.0)
Monocytes Absolute: 0.6 10*3/uL (ref 0.1–1.0)
Monocytes Relative: 7.8 % (ref 3.0–12.0)
Neutro Abs: 4.6 10*3/uL (ref 1.4–7.7)
Neutrophils Relative %: 57.9 % (ref 43.0–77.0)
Platelets: 216 10*3/uL (ref 150.0–400.0)
RBC: 5.13 Mil/uL (ref 4.22–5.81)
RDW: 13.3 % (ref 11.5–15.5)
WBC: 7.9 10*3/uL (ref 4.0–10.5)

## 2018-10-04 LAB — HEMOGLOBIN A1C: Hgb A1c MFr Bld: 5.6 % (ref 4.6–6.5)

## 2018-10-04 LAB — TSH: TSH: 4.46 u[IU]/mL (ref 0.35–4.50)

## 2018-10-09 ENCOUNTER — Telehealth: Payer: Self-pay | Admitting: *Deleted

## 2018-10-09 NOTE — Telephone Encounter (Signed)
Left 2nd VM requesting pt to call the office back regarding lab results

## 2018-10-10 NOTE — Telephone Encounter (Signed)
Pt returned my call and addressed in lab results

## 2018-10-24 ENCOUNTER — Telehealth: Payer: Self-pay | Admitting: Family Medicine

## 2018-10-24 DIAGNOSIS — I10 Essential (primary) hypertension: Secondary | ICD-10-CM

## 2018-10-24 DIAGNOSIS — Z125 Encounter for screening for malignant neoplasm of prostate: Secondary | ICD-10-CM

## 2018-10-24 DIAGNOSIS — R748 Abnormal levels of other serum enzymes: Secondary | ICD-10-CM

## 2018-10-24 NOTE — Telephone Encounter (Signed)
-----   Message from Lendon Collar, RT sent at 10/17/2018  8:53 AM EST ----- Regarding: Lab orders for [facility] Dec 18th Please enter CPE lab orders for 10/25/18. Thanks!

## 2018-10-25 ENCOUNTER — Other Ambulatory Visit (INDEPENDENT_AMBULATORY_CARE_PROVIDER_SITE_OTHER): Payer: Commercial Managed Care - PPO

## 2018-10-25 DIAGNOSIS — I10 Essential (primary) hypertension: Secondary | ICD-10-CM | POA: Diagnosis not present

## 2018-10-25 DIAGNOSIS — R748 Abnormal levels of other serum enzymes: Secondary | ICD-10-CM | POA: Diagnosis not present

## 2018-10-25 DIAGNOSIS — Z125 Encounter for screening for malignant neoplasm of prostate: Secondary | ICD-10-CM | POA: Diagnosis not present

## 2018-10-25 LAB — BASIC METABOLIC PANEL
BUN: 18 mg/dL (ref 6–23)
CO2: 34 mEq/L — ABNORMAL HIGH (ref 19–32)
Calcium: 9.9 mg/dL (ref 8.4–10.5)
Chloride: 99 mEq/L (ref 96–112)
Creatinine, Ser: 0.99 mg/dL (ref 0.40–1.50)
GFR: 80.93 mL/min (ref >60.00)
Glucose, Bld: 114 mg/dL — ABNORMAL HIGH (ref 70–99)
Potassium: 4.5 mEq/L (ref 3.5–5.1)
Sodium: 140 mEq/L (ref 135–145)

## 2018-10-25 LAB — HEPATIC FUNCTION PANEL
ALT: 92 U/L — ABNORMAL HIGH (ref 0–53)
AST: 45 U/L — ABNORMAL HIGH (ref 0–37)
Albumin: 4.5 g/dL (ref 3.5–5.2)
Alkaline Phosphatase: 45 U/L (ref 39–117)
Bilirubin, Direct: 0.1 mg/dL (ref 0.0–0.3)
Total Bilirubin: 0.5 mg/dL (ref 0.2–1.2)
Total Protein: 7.1 g/dL (ref 6.0–8.3)

## 2018-10-25 LAB — PSA: PSA: 0.71 ng/mL (ref 0.10–4.00)

## 2018-10-25 LAB — LIPID PANEL
Cholesterol: 173 mg/dL (ref 0–200)
HDL: 34.3 mg/dL — ABNORMAL LOW (ref >39.00)
LDL Cholesterol: 114 mg/dL — ABNORMAL HIGH (ref 0–99)
NonHDL: 138.66
Total CHOL/HDL Ratio: 5
Triglycerides: 123 mg/dL (ref 0.0–149.0)
VLDL: 24.6 mg/dL (ref 0.0–40.0)

## 2018-10-31 ENCOUNTER — Ambulatory Visit (INDEPENDENT_AMBULATORY_CARE_PROVIDER_SITE_OTHER): Payer: Commercial Managed Care - PPO | Admitting: Family Medicine

## 2018-10-31 ENCOUNTER — Encounter: Payer: Self-pay | Admitting: Family Medicine

## 2018-10-31 VITALS — BP 130/70 | HR 72 | Temp 98.4°F | Ht 69.0 in | Wt 264.2 lb

## 2018-10-31 DIAGNOSIS — Z125 Encounter for screening for malignant neoplasm of prostate: Secondary | ICD-10-CM

## 2018-10-31 DIAGNOSIS — Z0000 Encounter for general adult medical examination without abnormal findings: Secondary | ICD-10-CM | POA: Diagnosis not present

## 2018-10-31 DIAGNOSIS — R7309 Other abnormal glucose: Secondary | ICD-10-CM | POA: Diagnosis not present

## 2018-10-31 DIAGNOSIS — R748 Abnormal levels of other serum enzymes: Secondary | ICD-10-CM

## 2018-10-31 DIAGNOSIS — I10 Essential (primary) hypertension: Secondary | ICD-10-CM

## 2018-10-31 DIAGNOSIS — E669 Obesity, unspecified: Secondary | ICD-10-CM

## 2018-10-31 DIAGNOSIS — E7800 Pure hypercholesterolemia, unspecified: Secondary | ICD-10-CM

## 2018-10-31 NOTE — Progress Notes (Signed)
Subjective:    Patient ID: Eric Spears, male    DOB: 08-23-1956, 62 y.o.   MRN: [id number]  HPI Here for health maintenance exam and to review chronic medical problems   Feeling the same as always   Wt Readings from Last 3 Encounters:  10/31/18 264 lb 4 oz (119.9 kg)  10/03/18 269 lb 4 oz (122.1 kg)  10/19/17 272 lb 4 oz (123.5 kg)  wt is down 5 lb  Walks 30 extra minutes a day  Has been eating better (green smoothie in the am) -almost every day - feels a bit better   39.02 kg/m   colonoscopy 10/15 with 5y recall  Polyp Father had colon ca in advanced age   Tetanus shot 1/16  Flu shot 9/19   Zoster status -never had shingles or a vaccine  Interested in vaccine   Prostate health Lab Results  Component Value Date   PSA 0.71 10/25/2018   PSA 0.44 10/14/2017   PSA 0.42 10/13/2016   no change in urinary habits   bp is much improved  No cp or palpitations or headaches or edema  No side effects to medicines  BP Readings from Last 3 Encounters:  10/31/18 130/70  10/03/18 (!) 172/85  10/19/17 128/78     H/o elevated glucose Lab Results  Component Value Date   HGBA1C 5.6 10/03/2018    H/o elevated lfts Lab Results  Component Value Date   ALT 92 (H) 10/25/2018   AST 45 (H) 10/25/2018   ALKPHOS 45 10/25/2018   BILITOT 0.5 10/25/2018   still drinks 2 martinis per day  He thinks he can cut back on etoh  No acetaminophen   Hyperlipidemia  Lab Results  Component Value Date   CHOL 173 10/25/2018   CHOL 145 10/14/2017   CHOL 159 10/13/2016   Lab Results  Component Value Date   HDL 34.30 (L) 10/25/2018   HDL 36.80 (L) 10/14/2017   HDL 39.60 10/13/2016   Lab Results  Component Value Date   LDLCALC 114 (H) 10/25/2018   LDLCALC 89 10/14/2017   LDLCALC 100 (H) 10/13/2016   Lab Results  Component Value Date   TRIG 123.0 10/25/2018   TRIG 97.0 10/14/2017   TRIG 99.0 10/13/2016   Lab Results  Component Value Date   CHOLHDL 5 10/25/2018   CHOLHDL 4  10/14/2017   CHOLHDL 4 10/13/2016   No results found for: LDLDIRECT   Eating more red meat  (holidays) LDL is up   Patient Active Problem List   Diagnosis Date Noted  . Hypertension 10/03/2018  . Obesity (BMI 30-39.9) 10/03/2018  . Enlargement of abdominal aorta ([place]) 12/26/2017  . Elevated liver enzymes 10/19/2017  . Elevated glucose level 10/18/2016  . Need for hepatitis C screening test 10/11/2016  . Somnolence, daytime 11/14/2014  . Routine general medical examination at a health care facility 11/05/2014  . Prostate cancer screening 11/05/2014  . Colon cancer screening 07/03/2014  . DYSPNEA 07/11/2009   Past Medical History:  Diagnosis Date  . Allergy   . BPH (benign prostatic hyperplasia)   . Depression   . ED (erectile dysfunction)   . Elevated liver function tests   . GERD (gastroesophageal reflux disease)   . Low HDL (under 40) 10/05   Past Surgical History:  Procedure Laterality Date  . Finger trauma    . NASAL SEPTUM SURGERY     Nose  . Stress cardiolite  1998   Normal   Social  History   Tobacco Use  . Smoking status: Never Smoker  . Smokeless tobacco: Never Used  Substance Use Topics  . Alcohol use: Yes    Alcohol/week: 2.0 standard drinks    Types: 2 Standard drinks or equivalent per week    Comment: occ  . Drug use: No   Family History  Problem Relation Age of Onset  . Cancer Mother        uterine  . Heart disease Mother        valve dysfunction in heart  . Heart disease Father        CVA  . Diabetes Maternal Grandmother   . Obesity Other   . Cancer Other        were smokers  . Colon polyps Cousin        first cousins maternal and paternal  . Colon cancer Other 9       mothers first cousin  . Rectal cancer Neg Hx   . Stomach cancer Neg Hx    Allergies  Allergen Reactions  . Bee Venom    Current Outpatient Medications on File Prior to Visit  Medication Sig Dispense Refill  . amLODipine (NORVASC) 5 MG tablet Take 1 tablet (5 mg  total) by mouth daily. 30 tablet 11  . aspirin 325 MG EC tablet Take 325 mg by mouth 2 (two) times daily.     . cetirizine (ZYRTEC) 10 MG tablet Take 10 mg by mouth daily as needed.      . hydrochlorothiazide (HYDRODIURIL) 25 MG tablet Take 1 tablet (25 mg total) by mouth daily. 30 tablet 11  . Multiple Vitamin (MULTIVITAMIN) tablet Take 1 tablet by mouth daily.       No current facility-administered medications on file prior to visit.     Review of Systems  Constitutional: Negative for activity change, appetite change, fatigue, fever and unexpected weight change.  HENT: Negative for congestion, rhinorrhea, sore throat and trouble swallowing.   Eyes: Negative for pain, redness, itching and visual disturbance.  Respiratory: Negative for cough, chest tightness, shortness of breath and wheezing.   Cardiovascular: Negative for chest pain and palpitations.  Gastrointestinal: Negative for abdominal pain, blood in stool, constipation, diarrhea and nausea.  Endocrine: Negative for cold intolerance, heat intolerance, polydipsia and polyuria.  Genitourinary: Negative for difficulty urinating, dysuria, frequency and urgency.  Musculoskeletal: Negative for arthralgias, joint swelling and myalgias.  Skin: Negative for pallor and rash.  Neurological: Negative for dizziness, tremors, weakness, numbness and headaches.  Hematological: Negative for adenopathy. Does not bruise/bleed easily.  Psychiatric/Behavioral: Negative for decreased concentration and dysphoric mood. The patient is not nervous/anxious.        Objective:   Physical Exam Constitutional:      General: He is not in acute distress.    Appearance: He is well-developed. He is obese. He is not ill-appearing.  HENT:     Head: Normocephalic and atraumatic.     Right Ear: Tympanic membrane, ear canal and external ear normal.     Left Ear: Tympanic membrane, ear canal and external ear normal.     Ears:     Comments: Partial cerumen obst     Nose: Nose normal.     Mouth/Throat:     Mouth: Mucous membranes are moist.     Pharynx: No posterior oropharyngeal erythema.  Eyes:     General: No scleral icterus.       Right eye: No discharge.        Left  eye: No discharge.     Conjunctiva/sclera: Conjunctivae normal.     Pupils: Pupils are equal, round, and reactive to light.  Neck:     Musculoskeletal: Normal range of motion and neck supple.     Thyroid: No thyromegaly.     Vascular: No carotid bruit or JVD.  Cardiovascular:     Rate and Rhythm: Normal rate and regular rhythm.     Heart sounds: Normal heart sounds. No gallop.   Pulmonary:     Effort: Pulmonary effort is normal. No respiratory distress.     Breath sounds: Normal breath sounds. No wheezing or rhonchi.  Chest:     Chest wall: No tenderness.  Abdominal:     General: Bowel sounds are normal. There is no distension or abdominal bruit.     Palpations: Abdomen is soft. There is no mass.     Tenderness: There is no abdominal tenderness.  Musculoskeletal:        General: No tenderness.     Right lower leg: No edema.     Left lower leg: No edema.  Lymphadenopathy:     Cervical: No cervical adenopathy.  Skin:    General: Skin is warm and dry.     Coloration: Skin is not pale.     Findings: No erythema, lesion or rash.  Neurological:     General: No focal deficit present.     Mental Status: He is alert.     Cranial Nerves: No cranial nerve deficit.     Motor: No abnormal muscle tone.     Coordination: Coordination normal.     Deep Tendon Reflexes: Reflexes are normal and symmetric.  Psychiatric:        Mood and Affect: Mood normal.           Assessment & Plan:   Problem List Items Addressed This Visit      Cardiovascular and Mediastinum   Hypertension    bp in fair control at this time (much improved)  BP Readings from Last 1 Encounters:  10/31/18 130/70   No changes needed Most recent labs reviewed  Disc lifstyle change with low sodium diet  and exercise        Relevant Orders   Comprehensive metabolic panel   Lipid panel     Other   Routine general medical examination at a health care facility - Primary    Reviewed health habits including diet and exercise and skin cancer prevention Reviewed appropriate screening tests for age  Also reviewed health mt list, fam hx and immunization status , as well as social and family history   See HPI Labs reviewed  Disc plan for high cholesterol and prediabetes  Planning more wt loss Disc etoh reduction for liver fxn tests F/u 6 mo      Prostate cancer screening    Lab Results  Component Value Date   PSA 0.71 10/25/2018   PSA 0.44 10/14/2017   PSA 0.42 10/13/2016   No symptoms  No fam hx       Obesity (BMI 30-39.9)    Discussed how this problem influences overall health and the risks it imposes  Reviewed plan for weight loss with lower calorie diet (via better food choices and also portion control or program like weight watchers) and exercise building up to or more than 30 minutes 5 days per week including some aerobic activity   Commended on wt loss 5 lb so far      Hyperlipidemia  LDL is up from last check  Disc red meat consumption (up with the holiday)  Disc goals for lipids and reasons to control them Rev last labs with pt Rev low sat fat diet in detail Re check 6 mo  Consider statin if needed       Elevated liver enzymes    Suspect due to fatty liver  Warned re: use of acetaminophen Also strongly recommended cutting back/stopping etoh  Will re check this at f/u in 6 mo      Elevated glucose level    Lab Results  Component Value Date   HGBA1C 5.6 10/03/2018   Disc prediabetes Working on wt loss  Try to get most of your carbohydrates from produce (with the exception of white potatoes)  Eat less bread/pasta/rice/snack foods/cereals/sweets and other items from the middle of the grocery store (processed carbs)       Relevant Orders   Hemoglobin  A1c

## 2018-10-31 NOTE — Patient Instructions (Addendum)
For weight loss and also diabetes prevention - Try to get most of your carbohydrates from produce (with the exception of white potatoes)  Eat less bread/pasta/rice/snack foods/cereals/sweets and other items from the middle of the grocery store (processed carbs)   For cholesterol  Avoid red meat/ fried foods/ egg yolks/ fatty breakfast meats/ butter, cheese and high fat dairy/ and shellfish    Contact your insurance co re: a shingles vaccine  If you are interested in the new shingles vaccine (Shingrix) - call your local pharmacy to check on coverage and availability  If affordable, get on a wait list at your pharmacy to get the vaccine. Call us as well to get on a waiting list if you know it is affordable  Follow up in 6 months with labs prior

## 2018-11-01 DIAGNOSIS — E785 Hyperlipidemia, unspecified: Secondary | ICD-10-CM | POA: Insufficient documentation

## 2018-11-01 NOTE — Assessment & Plan Note (Signed)
LDL is up from last check  Disc red meat consumption (up with the holiday)  Disc goals for lipids and reasons to control them Rev last labs with pt Rev low sat fat diet in detail Re check 6 mo  Consider statin if needed

## 2018-11-01 NOTE — Assessment & Plan Note (Signed)
Reviewed health habits including diet and exercise and skin cancer prevention Reviewed appropriate screening tests for age  Also reviewed health mt list, fam hx and immunization status , as well as social and family history   See HPI Labs reviewed  Disc plan for high cholesterol and prediabetes  Planning more wt loss Disc etoh reduction for liver fxn tests F/u 6 mo

## 2018-11-01 NOTE — Assessment & Plan Note (Signed)
Lab Results  Component Value Date   PSA 0.71 10/25/2018   PSA 0.44 10/14/2017   PSA 0.42 10/13/2016   No symptoms  No fam hx

## 2018-11-01 NOTE — Assessment & Plan Note (Signed)
Discussed how this problem influences overall health and the risks it imposes  Reviewed plan for weight loss with lower calorie diet (via better food choices and also portion control or program like weight watchers) and exercise building up to or more than 30 minutes 5 days per week including some aerobic activity   Commended on wt loss 5 lb so far

## 2018-11-01 NOTE — Assessment & Plan Note (Signed)
Suspect due to fatty liver  Warned re: use of acetaminophen Also strongly recommended cutting back/stopping etoh  Will re check this at f/u in 6 mo

## 2018-11-01 NOTE — Assessment & Plan Note (Signed)
Lab Results  Component Value Date   HGBA1C 5.6 10/03/2018   Disc prediabetes Working on wt loss  Try to get most of your carbohydrates from produce (with the exception of white potatoes)  Eat less bread/pasta/rice/snack foods/cereals/sweets and other items from the middle of the grocery store (processed carbs)

## 2018-11-01 NOTE — Assessment & Plan Note (Signed)
bp in fair control at this time (much improved)  BP Readings from Last 1 Encounters:  10/31/18 130/70   No changes needed Most recent labs reviewed  Disc lifstyle change with low sodium diet and exercise

## 2018-12-24 ENCOUNTER — Other Ambulatory Visit: Payer: Self-pay | Admitting: Family Medicine

## 2019-03-19 IMAGING — US US ABDOMEN COMPLETE
1 series · 14 of 25 positions shown · non-contrast
Comparison: None.

CLINICAL DATA: Elevated LFTs.

EXAM:
ABDOMEN ULTRASOUND COMPLETE

[Series 1: us abdomen complete · 0.20mm/px · 14 of 61 slices shown]
[im 1/61]
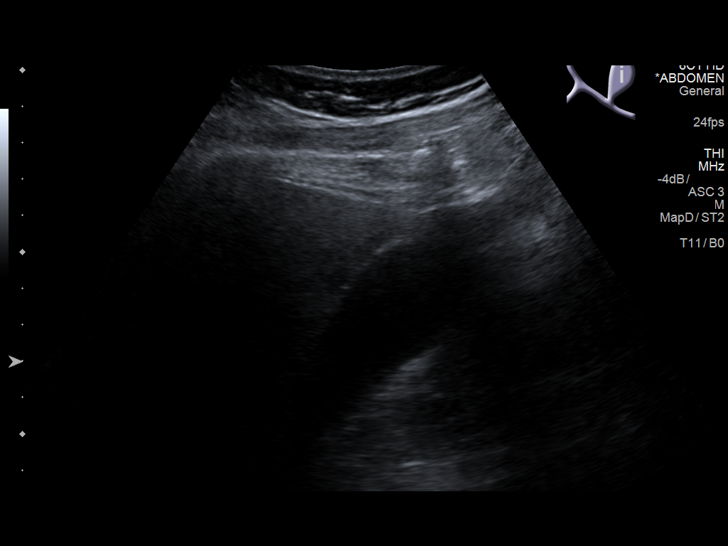
[im 6/61]
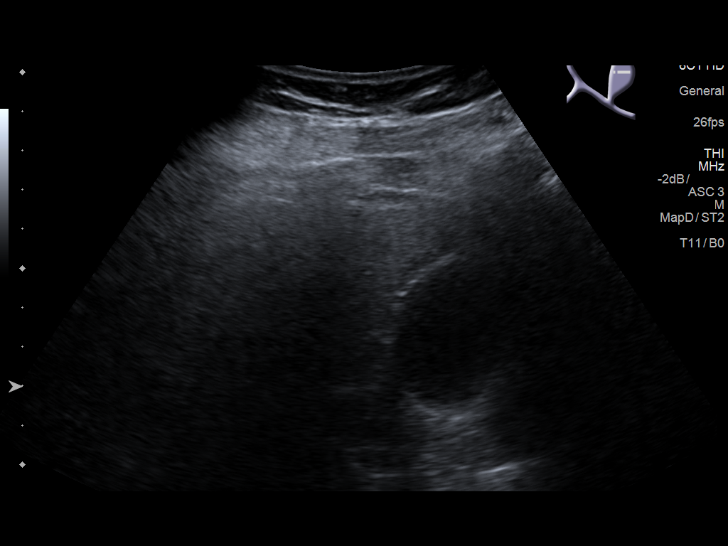
[im 11/61]
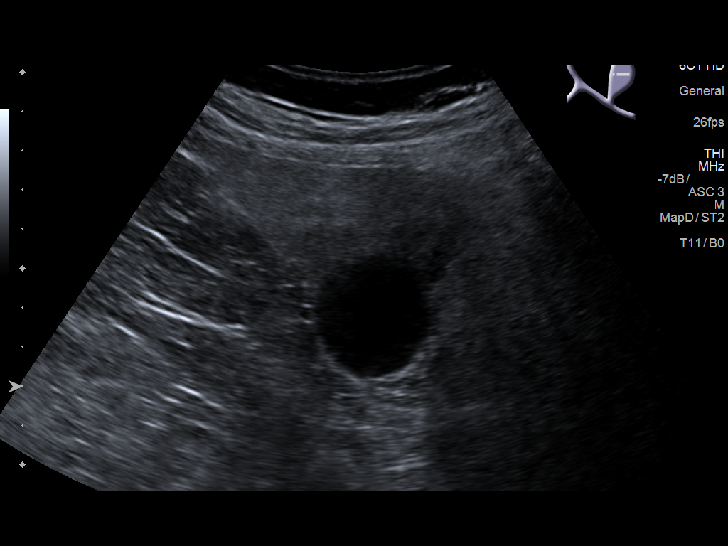
[im 16/61]
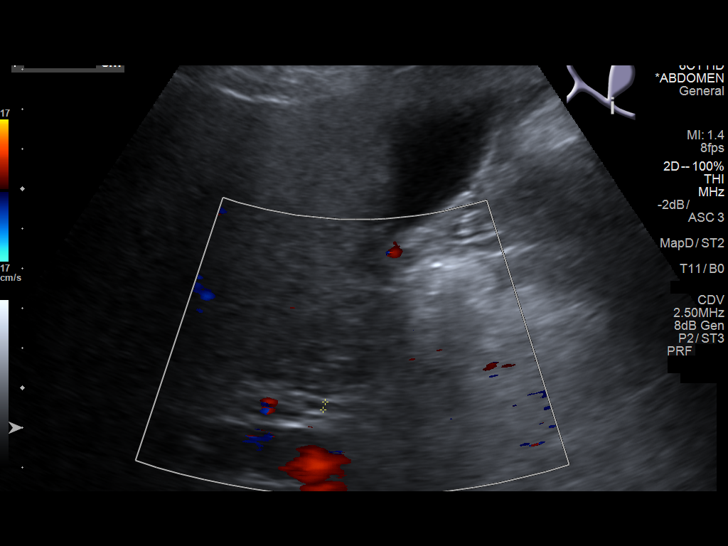
[im 21/61]
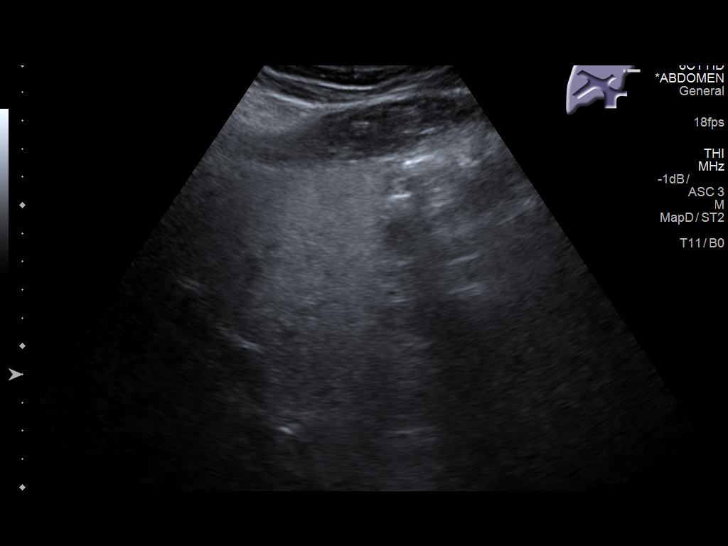
[im 23/61]
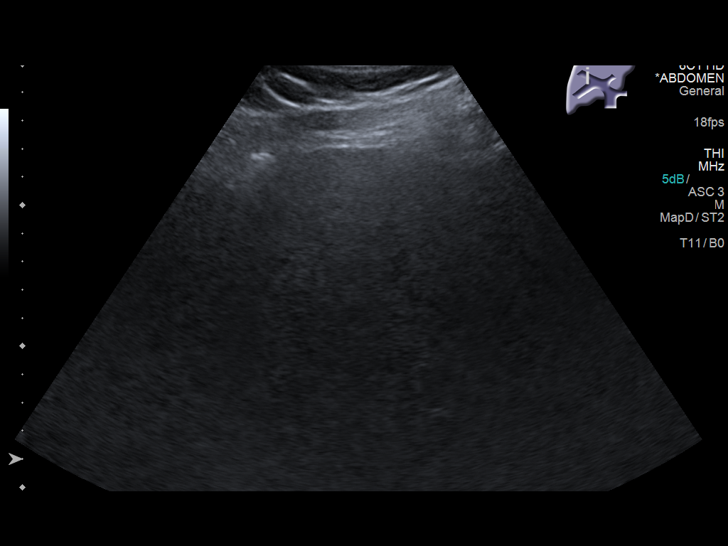
[im 28/61]
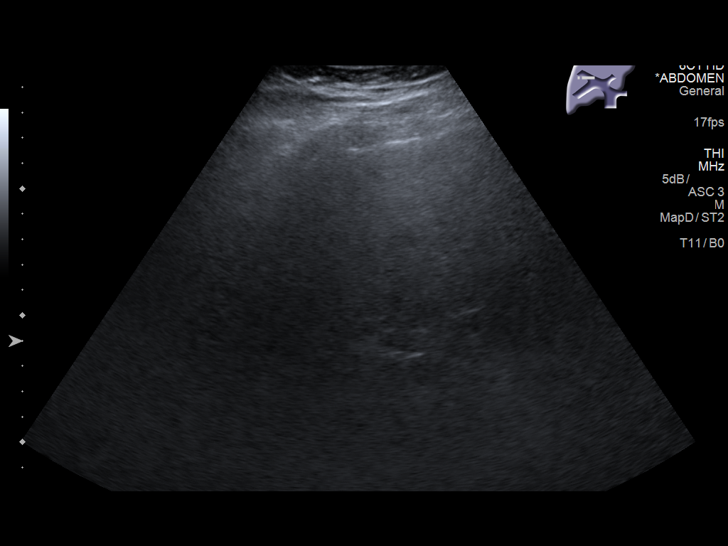
[im 33/61]
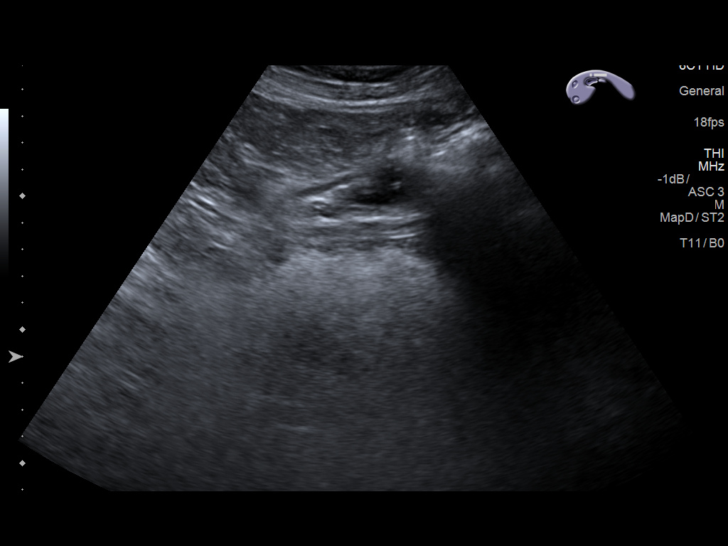
[im 38/61]
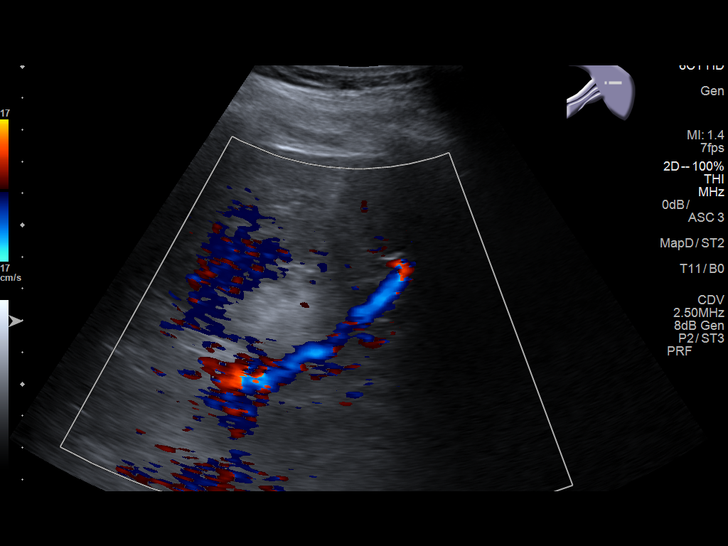
[im 41/61]
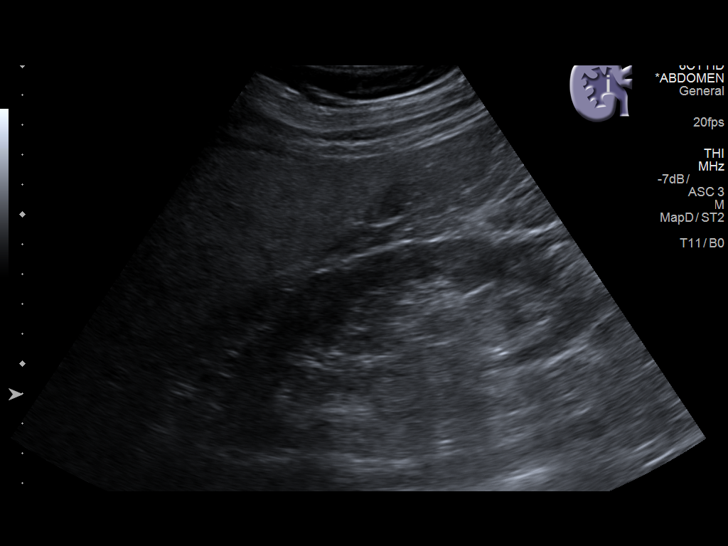
[im 46/61]
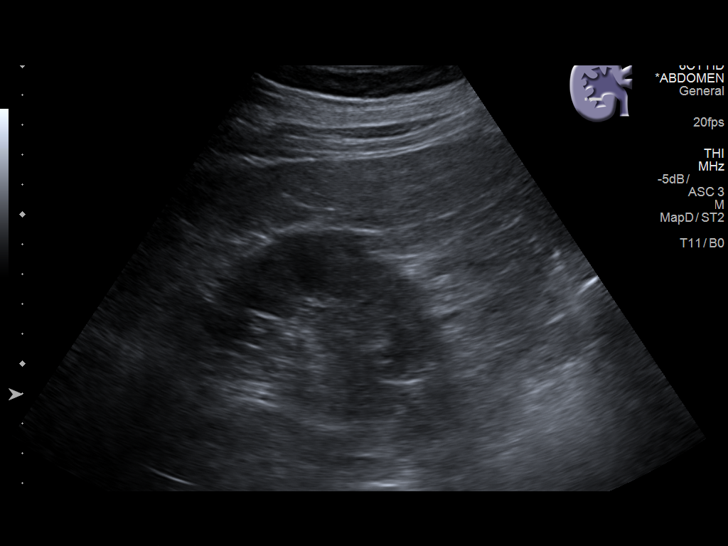
[im 51/61]
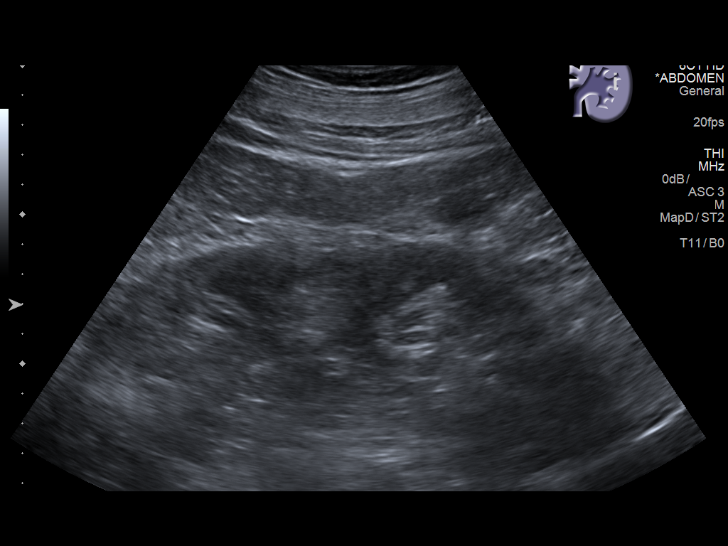
[im 56/61]
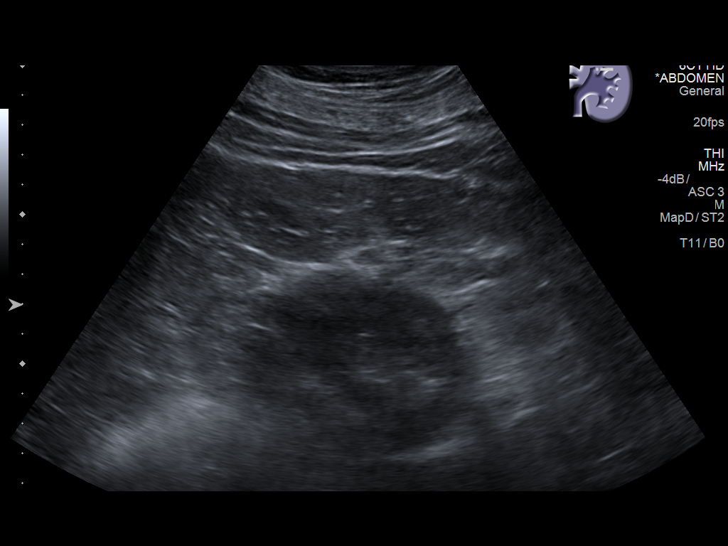
[im 61/61]
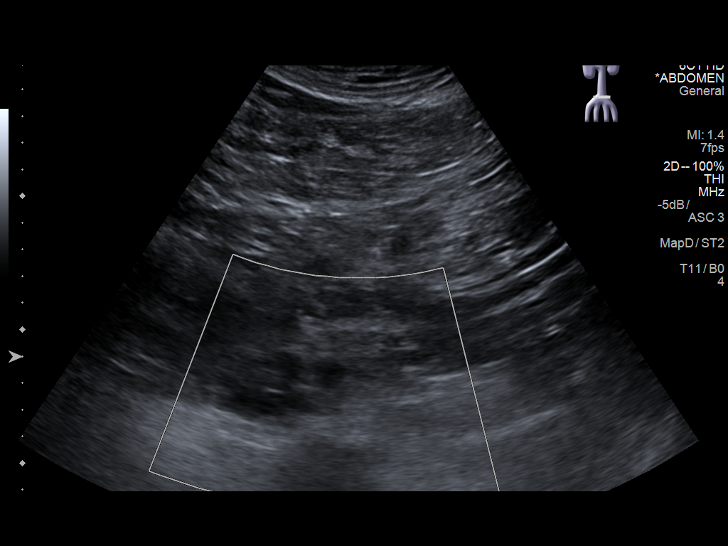

[14 of 25 positions shown; findings below may reference images not displayed]

FINDINGS: Gallbladder: No gallstones or wall thickening visualized. No
sonographic Murphy sign noted by sonographer.

Common bile duct: Diameter: 2 mm

Liver: Increased echotexture throughout the liver with no focal
mass. Portal vein is patent on color Doppler imaging with normal
direction of blood flow towards the liver.

IVC: Poor visualization.

Pancreas: Poor visualization.

Spleen: Size and appearance within normal limits.

Right Kidney: Length: 14.5 cm. Echogenicity within normal limits. No
mass or hydronephrosis visualized.

Left Kidney: Length: 14.9 cm. Echogenicity within normal limits. No
mass or hydronephrosis visualized.

Abdominal aorta: 3.2 cm proximally.

Other findings: None.
IMPRESSION: 1. Increased echogenicity in the liver is likely due to hepatic
steatosis.
2. Mild prominence of the proximal abdominal aorta measuring 3.2 cm.

## 2019-04-25 ENCOUNTER — Other Ambulatory Visit: Payer: Self-pay

## 2019-04-25 ENCOUNTER — Other Ambulatory Visit (INDEPENDENT_AMBULATORY_CARE_PROVIDER_SITE_OTHER): Payer: Commercial Managed Care - PPO

## 2019-04-25 DIAGNOSIS — I10 Essential (primary) hypertension: Secondary | ICD-10-CM | POA: Diagnosis not present

## 2019-04-25 DIAGNOSIS — R7309 Other abnormal glucose: Secondary | ICD-10-CM

## 2019-04-25 LAB — COMPREHENSIVE METABOLIC PANEL
ALT: 86 U/L — ABNORMAL HIGH (ref 0–53)
AST: 44 U/L — ABNORMAL HIGH (ref 0–37)
Albumin: 4.2 g/dL (ref 3.5–5.2)
Alkaline Phosphatase: 40 U/L (ref 39–117)
BUN: 16 mg/dL (ref 6–23)
CO2: 32 mEq/L (ref 19–32)
Calcium: 9.7 mg/dL (ref 8.4–10.5)
Chloride: 102 mEq/L (ref 96–112)
Creatinine, Ser: 0.75 mg/dL (ref 0.40–1.50)
GFR: 104.73 mL/min (ref >60.00)
Glucose, Bld: 101 mg/dL — ABNORMAL HIGH (ref 70–99)
Potassium: 4.6 mEq/L (ref 3.5–5.1)
Sodium: 139 mEq/L (ref 135–145)
Total Bilirubin: 0.5 mg/dL (ref 0.2–1.2)
Total Protein: 6.3 g/dL (ref 6.0–8.3)

## 2019-04-25 LAB — LIPID PANEL
Cholesterol: 155 mg/dL (ref 0–200)
HDL: 34.3 mg/dL — ABNORMAL LOW (ref >39.00)
LDL Cholesterol: 94 mg/dL (ref 0–99)
NonHDL: 120.94
Total CHOL/HDL Ratio: 5
Triglycerides: 135 mg/dL (ref 0.0–149.0)
VLDL: 27 mg/dL (ref 0.0–40.0)

## 2019-04-25 LAB — HEMOGLOBIN A1C: Hgb A1c MFr Bld: 5.6 % (ref 4.6–6.5)

## 2019-05-02 ENCOUNTER — Ambulatory Visit: Payer: Commercial Managed Care - PPO | Admitting: Family Medicine

## 2019-05-07 ENCOUNTER — Encounter: Payer: Self-pay | Admitting: Family Medicine

## 2019-05-07 ENCOUNTER — Other Ambulatory Visit: Payer: Self-pay

## 2019-05-07 ENCOUNTER — Ambulatory Visit: Payer: Commercial Managed Care - PPO | Admitting: Family Medicine

## 2019-05-07 VITALS — BP 134/80 | HR 66 | Temp 97.3°F | Ht 69.0 in | Wt 262.1 lb

## 2019-05-07 DIAGNOSIS — E669 Obesity, unspecified: Secondary | ICD-10-CM

## 2019-05-07 DIAGNOSIS — R7309 Other abnormal glucose: Secondary | ICD-10-CM | POA: Diagnosis not present

## 2019-05-07 DIAGNOSIS — R748 Abnormal levels of other serum enzymes: Secondary | ICD-10-CM | POA: Diagnosis not present

## 2019-05-07 DIAGNOSIS — E7800 Pure hypercholesterolemia, unspecified: Secondary | ICD-10-CM

## 2019-05-07 DIAGNOSIS — I10 Essential (primary) hypertension: Secondary | ICD-10-CM | POA: Diagnosis not present

## 2019-05-07 DIAGNOSIS — I77811 Abdominal aortic ectasia: Secondary | ICD-10-CM

## 2019-05-07 DIAGNOSIS — I7789 Other specified disorders of arteries and arterioles: Secondary | ICD-10-CM

## 2019-05-07 NOTE — Patient Instructions (Addendum)
Take care of yourself  Weight loss would help greatly with liver function and well being in general   Blood pressure is good on the 2nd check    Continue walking  Continue watching diet- try to cut fat and sugar calories as much as you can Try to get most of your carbohydrates from produce (with the exception of white potatoes)  Eat less bread/pasta/rice/snack foods/cereals/sweets and other items from the middle of the grocery store (processed carbs)  For cholesterol- try to eat less beef and more chicken/fish   Also limit or quit alcohol for better health

## 2019-05-07 NOTE — Assessment & Plan Note (Signed)
Lab Results  Component Value Date   HGBA1C 5.6 04/25/2019   disc imp of low glycemic diet and wt loss to prevent DM2

## 2019-05-07 NOTE — Assessment & Plan Note (Signed)
No clinical changes 

## 2019-05-07 NOTE — Progress Notes (Signed)
Subjective:    Patient ID: Eric Spears, male    DOB: [birth year], 63 y.o.   MRN: [id number]  HPI Here for 6 mo f/u of chronic medical problems   Working on and off through the pandemic    Wt Readings from Last 3 Encounters:  05/07/19 262 lb 1 oz (118.9 kg)  10/31/18 264 lb 4 oz (119.9 kg)  10/03/18 269 lb 4 oz (122.1 kg)  stable weight  Not really taking care of himself  Trying to eat a bit better-wants to loose weight -not aggressively yet  Walking 30 minutes per day  38.70 kg/m   Sleepy during the day-easily nods off - ? Obesity related Bad sleep habits in general    bp is stable today -improved on re check after sitting BP: 134/80   No cp or palpitations or headaches or edema  No side effects to medicines  BP Readings from Last 3 Encounters:  05/07/19 (!) 144/80  10/31/18 130/70  10/03/18 (!) 172/85     Amlodipine 5 hctz 25 mg  Lifestyle   Lab Results  Component Value Date   CREATININE 0.75 04/25/2019   BUN 16 04/25/2019   NA 139 04/25/2019   K 4.6 04/25/2019   CL 102 04/25/2019   CO2 32 04/25/2019   Lab Results  Component Value Date   ALT 86 (H) 04/25/2019   AST 44 (H) 04/25/2019   ALKPHOS 40 04/25/2019   BILITOT 0.5 04/25/2019   suspect fatty liver  Down from 44 and 92 last time ast, alt  etoh intake  Drinks 6-8 alcoholic drinks per week   He does not think he has an alcohol addiction    H/o elevated glucose Lab Results  Component Value Date   HGBA1C 5.6 04/25/2019   This is stable    Hyperlipidemia Lab Results  Component Value Date   CHOL 155 04/25/2019   CHOL 173 10/25/2018   CHOL 145 10/14/2017   Lab Results  Component Value Date   HDL 34.30 (L) 04/25/2019   HDL 34.30 (L) 10/25/2018   HDL 36.80 (L) 10/14/2017   Lab Results  Component Value Date   LDLCALC 94 04/25/2019   LDLCALC 114 (H) 10/25/2018   LDLCALC 89 10/14/2017   Lab Results  Component Value Date   TRIG 135.0 04/25/2019   TRIG 123.0 10/25/2018   TRIG 97.0  10/14/2017   Lab Results  Component Value Date   CHOLHDL 5 04/25/2019   CHOLHDL 5 10/25/2018   CHOLHDL 4 10/14/2017   No results found for: LDLDIRECT  Stable HDL- exercise may help  LDL is down below 100- commended   Patient Active Problem List   Diagnosis Date Noted  . Hyperlipidemia 11/01/2018  . Hypertension 10/03/2018  . Obesity (BMI 30-39.9) 10/03/2018  . Enlargement of abdominal aorta ([place]) 12/26/2017  . Elevated liver enzymes 10/19/2017  . Elevated glucose level 10/18/2016  . Need for hepatitis C screening test 10/11/2016  . Somnolence, daytime 11/14/2014  . Routine general medical examination at a health care facility 11/05/2014  . Prostate cancer screening 11/05/2014  . Colon cancer screening 07/03/2014  . DYSPNEA 07/11/2009   Past Medical History:  Diagnosis Date  . Allergy   . BPH (benign prostatic hyperplasia)   . Depression   . ED (erectile dysfunction)   . Elevated liver function tests   . GERD (gastroesophageal reflux disease)   . Low HDL (under 40) 10/05   Past Surgical History:  Procedure Laterality Date  .  Finger trauma    . NASAL SEPTUM SURGERY     Nose  . Stress cardiolite  1998   Normal   Social History   Tobacco Use  . Smoking status: Never Smoker  . Smokeless tobacco: Never Used  Substance Use Topics  . Alcohol use: Yes    Alcohol/week: 2.0 standard drinks    Types: 2 Standard drinks or equivalent per week    Comment: occ  . Drug use: No   Family History  Problem Relation Age of Onset  . Cancer Mother        uterine  . Heart disease Mother        valve dysfunction in heart  . Heart disease Father        CVA  . Diabetes Maternal Grandmother   . Obesity Other   . Cancer Other        were smokers  . Colon polyps Cousin        first cousins maternal and paternal  . Colon cancer Other 61       mothers first cousin  . Rectal cancer Neg Hx   . Stomach cancer Neg Hx    Allergies  Allergen Reactions  . Bee Venom    Current  Outpatient Medications on File Prior to Visit  Medication Sig Dispense Refill  . amLODipine (NORVASC) 5 MG tablet TAKE 1 TABLET BY MOUTH EVERY DAY 90 tablet 2  . aspirin 325 MG EC tablet Take 325 mg by mouth 2 (two) times daily.     . cetirizine (ZYRTEC) 10 MG tablet Take 10 mg by mouth daily as needed.      . hydrochlorothiazide (HYDRODIURIL) 25 MG tablet TAKE 1 TABLET BY MOUTH EVERY DAY 90 tablet 2  . Multiple Vitamin (MULTIVITAMIN) tablet Take 1 tablet by mouth daily.       No current facility-administered medications on file prior to visit.     Review of Systems  Constitutional: Positive for fatigue. Negative for activity change, appetite change, fever and unexpected weight change.  HENT: Negative for congestion, rhinorrhea, sore throat and trouble swallowing.   Eyes: Negative for pain, redness, itching and visual disturbance.  Respiratory: Negative for cough, chest tightness, shortness of breath and wheezing.   Cardiovascular: Negative for chest pain and palpitations.  Gastrointestinal: Negative for abdominal pain, blood in stool, constipation, diarrhea and nausea.  Endocrine: Negative for cold intolerance, heat intolerance, polydipsia and polyuria.  Genitourinary: Negative for difficulty urinating, dysuria, frequency and urgency.  Musculoskeletal: Negative for arthralgias, joint swelling and myalgias.  Skin: Negative for pallor and rash.  Neurological: Negative for dizziness, tremors, weakness, numbness and headaches.  Hematological: Negative for adenopathy. Does not bruise/bleed easily.  Psychiatric/Behavioral: Negative for decreased concentration and dysphoric mood. The patient is not nervous/anxious.        Interrupted sleep        Objective:   Physical Exam Constitutional:      General: He is not in acute distress.    Appearance: Normal appearance. He is well-developed. He is obese. He is not ill-appearing or diaphoretic.  HENT:     Head: Normocephalic and atraumatic.      Mouth/Throat:     Mouth: Mucous membranes are moist.     Pharynx: Oropharynx is clear.  Eyes:     General: No scleral icterus.    Conjunctiva/sclera: Conjunctivae normal.     Pupils: Pupils are equal, round, and reactive to light.  Neck:     Musculoskeletal: Normal range of  motion and neck supple.     Thyroid: No thyromegaly.     Vascular: No carotid bruit or JVD.  Cardiovascular:     Rate and Rhythm: Normal rate and regular rhythm.     Heart sounds: Normal heart sounds. No gallop.   Pulmonary:     Effort: Pulmonary effort is normal. No respiratory distress.     Breath sounds: Normal breath sounds. No wheezing or rales.  Abdominal:     General: Bowel sounds are normal. There is no distension or abdominal bruit.     Palpations: Abdomen is soft. There is no mass.     Tenderness: There is no abdominal tenderness.  Musculoskeletal:     Right lower leg: No edema.     Left lower leg: No edema.  Lymphadenopathy:     Cervical: No cervical adenopathy.  Skin:    General: Skin is warm and dry.     Findings: No rash.  Neurological:     Mental Status: He is alert. Mental status is at baseline.     Coordination: Coordination normal.     Deep Tendon Reflexes: Reflexes are normal and symmetric. Reflexes normal.  Psychiatric:        Mood and Affect: Mood normal.           Assessment & Plan:   Problem List Items Addressed This Visit      Cardiovascular and Mediastinum   Enlargement of abdominal aorta (HCC)    No clinical changes      Hypertension - Primary    bp in fair control at this time  BP Readings from Last 1 Encounters:  05/07/19 134/80   No changes needed (continues amlodipine and hctz) With wt loss pt wishes to get off some medication if able  Most recent labs reviewed  Disc lifstyle change with low sodium diet and exercise          Other   Elevated glucose level    Lab Results  Component Value Date   HGBA1C 5.6 04/25/2019   disc imp of low glycemic diet  and wt loss to prevent DM2       Elevated liver enzymes    This is fairly stable / from fatty liver (rev last [place]) Strongly enc to avoid etoh/ acetaminophen and other liver toxic subs  Also enc wt loss and lower fat diet as well as exercise  This may normalize with wt loss      Obesity (BMI 30-39.9)    Discussed how this problem influences overall health and the risks it imposes  Reviewed plan for weight loss with lower calorie diet (via better food choices and also portion control or program like weight watchers) and exercise building up to or more than 30 minutes 5 days per week including some aerobic activity   Strongly enc wt loss with lower calorie intake and more physical activity      Hyperlipidemia    Disc goals for lipids and reasons to control them Rev last labs with pt Rev low sat fat diet in detail LDL is down-commended for that  HDL still low-enc exercise

## 2019-05-07 NOTE — Assessment & Plan Note (Signed)
Discussed how this problem influences overall health and the risks it imposes  Reviewed plan for weight loss with lower calorie diet (via better food choices and also portion control or program like weight watchers) and exercise building up to or more than 30 minutes 5 days per week including some aerobic activity   Strongly enc wt loss with lower calorie intake and more physical activity

## 2019-05-07 NOTE — Assessment & Plan Note (Signed)
This is fairly stable / from fatty liver (rev last [place]) Strongly enc to avoid etoh/ acetaminophen and other liver toxic subs  Also enc wt loss and lower fat diet as well as exercise  This may normalize with wt loss

## 2019-05-07 NOTE — Assessment & Plan Note (Signed)
bp in fair control at this time  BP Readings from Last 1 Encounters:  05/07/19 134/80   No changes needed (continues amlodipine and hctz) With wt loss pt wishes to get off some medication if able  Most recent labs reviewed  Disc lifstyle change with low sodium diet and exercise

## 2019-05-07 NOTE — Assessment & Plan Note (Signed)
Disc goals for lipids and reasons to control them Rev last labs with pt Rev low sat fat diet in detail LDL is down-commended for that  HDL still low-enc exercise

## 2019-06-20 ENCOUNTER — Encounter: Payer: Self-pay | Admitting: Gastroenterology

## 2019-07-25 ENCOUNTER — Encounter: Payer: Self-pay | Admitting: Gastroenterology

## 2019-09-11 ENCOUNTER — Encounter: Payer: Self-pay | Admitting: Gastroenterology

## 2019-09-24 ENCOUNTER — Other Ambulatory Visit: Payer: Self-pay | Admitting: Family Medicine

## 2019-10-08 ENCOUNTER — Other Ambulatory Visit: Payer: Self-pay

## 2019-10-08 ENCOUNTER — Ambulatory Visit (AMBULATORY_SURGERY_CENTER): Payer: Self-pay

## 2019-10-08 VITALS — Temp 97.6°F | Ht 69.0 in | Wt 267.0 lb

## 2019-10-08 DIAGNOSIS — Z86010 Personal history of colonic polyps: Secondary | ICD-10-CM

## 2019-10-08 MED ORDER — NA SULFATE-K SULFATE-MG SULF 17.5-3.13-1.6 GM/177ML PO SOLN: 1.0000 | Freq: Once | ORAL | 0 refills | Status: AC

## 2019-10-08 NOTE — Progress Notes (Signed)
Denies allergies to eggs or soy products. Denies complication of anesthesia or sedation. Denies use of weight loss medication. Denies use of O2.   Emmi instructions given for colonoscopy.  Covid screening is scheduled for 10/17/19 @ 1:40 Pm. A 15.00 coupon for Suprep was given to the patient.

## 2019-10-09 ENCOUNTER — Encounter: Payer: Self-pay | Admitting: Gastroenterology

## 2019-10-17 ENCOUNTER — Ambulatory Visit (INDEPENDENT_AMBULATORY_CARE_PROVIDER_SITE_OTHER): Payer: Commercial Managed Care - PPO

## 2019-10-17 ENCOUNTER — Other Ambulatory Visit: Payer: Self-pay | Admitting: Gastroenterology

## 2019-10-17 DIAGNOSIS — Z1159 Encounter for screening for other viral diseases: Secondary | ICD-10-CM

## 2019-10-18 LAB — SARS CORONAVIRUS 2 (TAT 6-24 HRS): SARS Coronavirus 2: NEGATIVE

## 2019-10-22 ENCOUNTER — Encounter: Payer: Self-pay | Admitting: Gastroenterology

## 2019-10-22 ENCOUNTER — Ambulatory Visit (AMBULATORY_SURGERY_CENTER): Payer: Commercial Managed Care - PPO | Admitting: Gastroenterology

## 2019-10-22 ENCOUNTER — Telehealth: Payer: Self-pay | Admitting: Gastroenterology

## 2019-10-22 ENCOUNTER — Other Ambulatory Visit: Payer: Self-pay

## 2019-10-22 VITALS — BP 140/69 | HR 60 | Temp 98.0°F | Resp 16 | Ht 69.0 in | Wt 267.0 lb

## 2019-10-22 DIAGNOSIS — Z860101 Personal history of adenomatous and serrated colon polyps: Secondary | ICD-10-CM

## 2019-10-22 DIAGNOSIS — K635 Polyp of colon: Secondary | ICD-10-CM | POA: Diagnosis not present

## 2019-10-22 DIAGNOSIS — Z86010 Personal history of colonic polyps: Secondary | ICD-10-CM | POA: Diagnosis not present

## 2019-10-22 DIAGNOSIS — D125 Benign neoplasm of sigmoid colon: Secondary | ICD-10-CM

## 2019-10-22 DIAGNOSIS — D123 Benign neoplasm of transverse colon: Secondary | ICD-10-CM

## 2019-10-22 MED ORDER — SODIUM CHLORIDE 0.9 % IV SOLN: 500.0000 mL | Freq: Once | INTRAVENOUS | Status: DC

## 2019-10-22 MED ORDER — FLEET ENEMA 7-19 GM/118ML RE ENEM
1.0000 | ENEMA | Freq: Once | RECTAL | Status: AC
  Administered 2019-10-22: 1 via RECTAL

## 2019-10-22 NOTE — Progress Notes (Signed)
Called to room to assist during endoscopic procedure.  Patient ID and intended procedure confirmed with present staff. Received instructions for my participation in the procedure from the performing physician.  

## 2019-10-22 NOTE — Progress Notes (Signed)
Pt could only get 1/2 enema in.  Results clear, yellow.

## 2019-10-22 NOTE — Telephone Encounter (Signed)
Called the patient back and relayed to him that he shouldn't take any NSAIDS for 2 weeks, to just rest today and no strenuous activity for 5-7 days per Dr. Fuller Plan. He should also let us know if he has any more persistent bleeding or GI symptoms. Pt verbalized understanding. SM

## 2019-10-22 NOTE — Op Note (Addendum)
[name] Patient Name: Eric Spears Procedure Date: 10/22/2019 8:26 AM MRN: [id number] Endoscopist: Ladene Artist , MD Age: 63 Referring MD:  Date of Birth: Feb 01, 1956 Gender: Male Account #: [id number] [place]

## 2019-10-22 NOTE — Telephone Encounter (Signed)
Rest at home today. No NSAIDs for 2 weeks. No strenuous activity for 5-7 days. Call if bleeding persists or other GI symptoms develop.

## 2019-10-22 NOTE — Patient Instructions (Addendum)
Please read all of the handouts given to you by your recovery room nurse. You will need a repeat colonoscopy in three - five years.  You will need a two day prep at that time.  YOU HAD AN ENDOSCOPIC PROCEDURE TODAY AT [facility]:   Refer to the procedure report that was given to you for any specific questions about what was found during the examination.  If the procedure report does not answer your questions, please call your gastroenterologist to clarify.  If you requested that your care partner not be given the details of your procedure findings, then the procedure report has been included in a sealed envelope for you to review at your convenience later.  YOU SHOULD EXPECT: Some feelings of bloating in the abdomen. Passage of more gas than usual.  Walking can help get rid of the air that was put into your GI tract during the procedure and reduce the bloating. If you had a lower endoscopy (such as a colonoscopy or flexible sigmoidoscopy) you may notice spotting of blood in your stool or on the toilet paper. If you underwent a bowel prep for your procedure, you may not have a normal bowel movement for a few days.  Please Note:  You might notice some irritation and congestion in your nose or some drainage.  This is from the oxygen used during your procedure.  There is no need for concern and it should clear up in a day or so.  SYMPTOMS TO REPORT IMMEDIATELY:   Following lower endoscopy (colonoscopy or flexible sigmoidoscopy):  Excessive amounts of blood in the stool  Significant tenderness or worsening of abdominal pains  Swelling of the abdomen that is new, acute  Fever of 100F or higher   For urgent or emergent issues, a gastroenterologist can be reached at any hour by calling [phone].   DIET:  We do recommend a small meal at first, but then you may proceed to your regular diet.  Drink plenty of fluids but you should avoid alcoholic beverages for 24 hours. Try to  increase the fiber in your diet, and drink plenty of water.  ACTIVITY:  You should plan to take it easy for the rest of today and you should NOT DRIVE or use heavy machinery until tomorrow (because of the sedation medicines used during the test).    FOLLOW UP: Our staff will call the number listed on your records 48-72 hours following your procedure to check on you and address any questions or concerns that you may have regarding the information given to you following your procedure. If we do not reach you, we will leave a message.  We will attempt to reach you two times.  During this call, we will ask if you have developed any symptoms of COVID 19. If you develop any symptoms (ie: fever, flu-like symptoms, shortness of breath, cough etc.) before then, please call [phone].  If you test positive for Covid 19 in the 2 weeks post procedure, please call and report this information to [place].    If any biopsies were taken you will be contacted by phone or by letter within the next 1-3 weeks.  Please call us at [phone] if you have not heard about the biopsies in 3 weeks.    SIGNATURES/CONFIDENTIALITY: You and/or your care partner have signed paperwork which will be entered into your electronic medical record.  These signatures attest to the fact that that the information above on your After Visit  Summary has been reviewed and is understood.  Full responsibility of the confidentiality of this discharge information lies with you and/or your care-partner. 

## 2019-10-22 NOTE — Telephone Encounter (Signed)
Pt called back post procedure and is having clots of blood post polypectomy. I told his this was normal to see some spotting of blood but if it worsens to let us know. Will send this message to Dr. Fuller Plan.

## 2019-10-22 NOTE — Op Note (Signed)
[facility] Patient Name: Eric Spears Procedure Date: 10/22/2019 8:49 AM MRN: [id number] Endoscopist: Ladene Artist , MD Age: 63 Referring MD:  Date of Birth: 1956-02-03 Gender: Male Account #: [id number] Procedure:                Colonoscopy Indications:              Surveillance: Personal history of adenomatous                            polyps on last colonoscopy 5 years ago Medicines:                Monitored Anesthesia Care Procedure:                Pre-Anesthesia Assessment:                           - Prior to the procedure, a History and Physical                            was performed, and patient medications and                            allergies were reviewed. The patient's tolerance of                            previous anesthesia was also reviewed. The risks                            and benefits of the procedure and the sedation                            options and risks were discussed with the patient.                            All questions were answered, and informed consent                            was obtained. Prior Anticoagulants: The patient has                            taken no previous anticoagulant or antiplatelet                            agents. ASA Grade Assessment: II - A patient with                            mild systemic disease. After reviewing the risks                            and benefits, the patient was deemed in                            satisfactory condition to undergo the procedure.  After obtaining informed consent, the colonoscope                            was passed under direct vision. Throughout the                            procedure, the patient's blood pressure, pulse, and                            oxygen saturations were monitored continuously. The                            Colonoscope was introduced through the anus and                            advanced to the the cecum,  identified by                            appendiceal orifice and ileocecal valve. The                            ileocecal valve, appendiceal orifice, and rectum                            were photographed. The quality of the bowel                            preparation was adequate. The colonoscopy was                            performed without difficulty. The patient tolerated                            the procedure well. Techinical problems occured                            images all photos were lost. Scope In: Scope Out: Findings:                 The perianal and digital rectal examinations were                            normal.                           Three sessile polyps were found in the sigmoid                            colon and transverse colon. The polyps were 5 to 7                            mm in size. These polyps were removed with a cold                            snare. Resection  and retrieval were complete.                           Multiple small-mouthed diverticula were found in                            the left colon. There was no evidence of                            diverticular bleeding.                           Internal hemorrhoids were found during                            retroflexion. The hemorrhoids were small and Grade                            I (internal hemorrhoids that do not prolapse).                           The exam was otherwise without abnormality on                            direct and retroflexion views. Complications:            No immediate complications. Estimated blood loss:                            None. Estimated Blood Loss:     Estimated blood loss: none. Impression:               - Three 5 to 7 mm polyps in the sigmoid colon and                            in the transverse colon, removed with a cold snare.                            Resected and retrieved.                           - Moderate diverticulosis in the  left colon. There                            was no evidence of diverticular bleeding.                           - Internal hemorrhoids.                           - The examination was otherwise normal on direct                            and retroflexion views. Recommendation:           - Repeat colonoscopy in 3-5 years for surveillance,  pending pathology review with an extended bowel                            prep.                           - Patient has a contact number available for                            emergencies. The signs and symptoms of potential                            delayed complications were discussed with the                            patient. Return to normal activities tomorrow.                            Written discharge instructions were provided to the                            patient.                           - Resume previous diet.                           - Continue present medications.                           - Await pathology results. Ladene Artist, MD 10/22/2019 9:34:51 AM This report has been signed electronically.

## 2019-10-22 NOTE — Progress Notes (Signed)
A/ox3, pleased with MAC, report to RN 

## 2019-10-24 ENCOUNTER — Encounter: Payer: Self-pay | Admitting: Gastroenterology

## 2019-10-24 ENCOUNTER — Telehealth: Payer: Self-pay

## 2019-10-24 NOTE — Telephone Encounter (Signed)
  Follow up Call-  Call back number 10/22/2019  Post procedure Call Back phone  # [phone]  Permission to leave phone message Yes  Some recent data might be hidden     Patient questions:  Do you have a fever, pain , or abdominal swelling? No. Pain Score  0 *  Have you tolerated food without any problems? Yes.    Have you been able to return to your normal activities? Yes.    Do you have any questions about your discharge instructions: Diet   No. Medications  No. Follow up visit  No.  Do you have questions or concerns about your Care? No.  Actions: * If pain score is 4 or above: No action needed, pain <4. 1. Have you developed a fever since your procedure? no  2.   Have you had an respiratory symptoms (SOB or cough) since your procedure? no  3.   Have you tested positive for COVID 19 since your procedure no  4.   Have you had any family members/close contacts diagnosed with the COVID 19 since your procedure?  no   If yes to any of these questions please route to Joylene John, RN and Alphonsa Gin, Therapist, sports.

## 2019-10-30 ENCOUNTER — Telehealth: Payer: Self-pay | Admitting: Family Medicine

## 2019-10-30 DIAGNOSIS — Z125 Encounter for screening for malignant neoplasm of prostate: Secondary | ICD-10-CM

## 2019-10-30 DIAGNOSIS — E7800 Pure hypercholesterolemia, unspecified: Secondary | ICD-10-CM

## 2019-10-30 DIAGNOSIS — Z0000 Encounter for general adult medical examination without abnormal findings: Secondary | ICD-10-CM

## 2019-10-30 DIAGNOSIS — I10 Essential (primary) hypertension: Secondary | ICD-10-CM

## 2019-10-30 DIAGNOSIS — R7309 Other abnormal glucose: Secondary | ICD-10-CM

## 2019-10-30 NOTE — Telephone Encounter (Signed)
-----   Message from Ellamae Sia sent at 10/24/2019 10:14 AM EST ----- Regarding: lab orders for Wednesday, 12.23.20 Patient is scheduled for CPX labs, please order future labs, Thanks , Karna Christmas

## 2019-10-31 ENCOUNTER — Other Ambulatory Visit: Payer: Commercial Managed Care - PPO

## 2019-11-01 ENCOUNTER — Ambulatory Visit
Admission: EM | Admit: 2019-11-01 | Discharge: 2019-11-01 | Disposition: A | Discharge status: Home / Self Care | Payer: Commercial Managed Care - PPO

## 2019-11-01 ENCOUNTER — Telehealth: Payer: Self-pay

## 2019-11-01 ENCOUNTER — Encounter: Payer: Self-pay | Admitting: Emergency Medicine

## 2019-11-01 DIAGNOSIS — M7981 Nontraumatic hematoma of soft tissue: Secondary | ICD-10-CM | POA: Diagnosis not present

## 2019-11-01 DIAGNOSIS — T148XXA Other injury of unspecified body region, initial encounter: Secondary | ICD-10-CM

## 2019-11-01 NOTE — Telephone Encounter (Signed)
I will watch for correspondence, thanks

## 2019-11-01 NOTE — ED Provider Notes (Signed)
[name]    CSN: [id number] Arrival date & time: 11/01/19  1223      History   Chief Complaint Chief Complaint  Patient presents with  . Leg Issue    HPI Eric Spears is a 63 y.o. male.   Patient presents with a "dent" in his left lower leg since yesterday.  He denies falls or injury; He denies any known cause.  He states it is very slightly tender to palpation but has not had any redness or warmth.  He currently has 0/10 no pain.  He denies fever, chills, shortness of breath, difficulty walking, numbness, paresthesias, weakness, or other symptoms.    The history is provided by the patient.    Past Medical History:  Diagnosis Date  . Allergy   . BPH (benign prostatic hyperplasia)   . Depression   . ED (erectile dysfunction)   . Elevated liver function tests   . GERD (gastroesophageal reflux disease)   . Hypertension   . Low HDL (under 40) 10/05    Patient Active Problem List   Diagnosis Date Noted  . Hyperlipidemia 11/01/2018  . Hypertension 10/03/2018  . Obesity (BMI 30-39.9) 10/03/2018  . Enlargement of abdominal aorta ([place]) 12/26/2017  . Elevated liver enzymes 10/19/2017  . Elevated glucose level 10/18/2016  . Need for hepatitis C screening test 10/11/2016  . Somnolence, daytime 11/14/2014  . Routine general medical examination at a health care facility 11/05/2014  . Prostate cancer screening 11/05/2014  . Colon cancer screening 07/03/2014  . DYSPNEA 07/11/2009    Past Surgical History:  Procedure Laterality Date  . Finger trauma    . NASAL SEPTUM SURGERY     Nose  . Stress cardiolite  1998   Normal       Home Medications    Prior to Admission medications   Medication Sig Start Date End Date Taking? Authorizing Provider  amLODipine (NORVASC) 5 MG tablet TAKE 1 TABLET BY MOUTH EVERY DAY 09/24/19   Tower, Wynelle Fanny, MD  aspirin 325 MG EC tablet Take 325 mg by mouth 2 (two) times daily.     [provider]  cetirizine (ZYRTEC)  10 MG tablet Take 10 mg by mouth daily as needed.      [provider]  hydrochlorothiazide (HYDRODIURIL) 25 MG tablet TAKE 1 TABLET BY MOUTH EVERY DAY 09/24/19   Tower, Wynelle Fanny, MD  Multiple Vitamin (MULTIVITAMIN) tablet Take 1 tablet by mouth daily.      [provider]  OVER THE COUNTER MEDICATION B complex vitamin. One tablet daily.    [provider]  OVER THE COUNTER MEDICATION Fish Oil, one capsule daily.    [provider]  OVER THE COUNTER MEDICATION Flaxseed, one tablet daily.    [provider]    Family History Family History  Problem Relation Age of Onset  . Heart disease Father        CVA  . Cancer Other        were smokers  . Cancer Mother        uterine  . Heart disease Mother        valve dysfunction in heart  . Diabetes Maternal Grandmother   . Obesity Other   . Colon polyps Cousin        first cousins maternal and paternal  . Colon cancer Other 8       mothers first cousin  . Rectal cancer Neg Hx   . Stomach cancer  Neg Hx     Social History Social History   Tobacco Use  . Smoking status: Never Smoker  . Smokeless tobacco: Never Used  Substance Use Topics  . Alcohol use: Yes    Alcohol/week: 2.0 standard drinks    Types: 2 Standard drinks or equivalent per week    Comment: occ  . Drug use: No     Allergies   Bee venom   Review of Systems Review of Systems  Constitutional: Negative for chills and fever.  HENT: Negative for ear pain and sore throat.   Eyes: Negative for pain and visual disturbance.  Respiratory: Negative for cough and shortness of breath.   Cardiovascular: Negative for chest pain and palpitations.  Gastrointestinal: Negative for abdominal pain and vomiting.  Genitourinary: Negative for dysuria and hematuria.  Musculoskeletal: Negative for arthralgias, back pain, gait problem, joint swelling and myalgias.  Skin: Negative for color change and rash.  Neurological: Negative for  seizures, syncope, weakness and numbness.  All other systems reviewed and are negative.    Physical Exam Triage Vital Signs ED Triage Vitals [11/01/19 1225]  Enc Vitals Group     BP      Pulse      Resp      Temp      Temp src      SpO2      Weight      Height      Head Circumference      Peak Flow      Pain Score 0     Pain Loc      Pain Edu?      Excl. in [place]?    No data found.  Updated Vital Signs BP (!) 145/84   Pulse 61   Temp 98.2 F (36.8 C)   Resp 16   SpO2 96%   Visual Acuity Right Eye Distance:   Left Eye Distance:   Bilateral Distance:    Right Eye Near:   Left Eye Near:    Bilateral Near:     Physical Exam Vitals and nursing note reviewed.  Constitutional:      General: He is not in acute distress.    Appearance: He is well-developed. He is not ill-appearing.  HENT:     Head: Normocephalic and atraumatic.     Mouth/Throat:     Mouth: Mucous membranes are moist.     Pharynx: Oropharynx is clear.  Eyes:     Conjunctiva/sclera: Conjunctivae normal.  Cardiovascular:     Rate and Rhythm: Normal rate and regular rhythm.     Heart sounds: No murmur.  Pulmonary:     Effort: Pulmonary effort is normal. No respiratory distress.     Breath sounds: Normal breath sounds. No wheezing or rhonchi.  Abdominal:     Palpations: Abdomen is soft.     Tenderness: There is no abdominal tenderness. There is no guarding or rebound.  Musculoskeletal:        General: No tenderness. Normal range of motion.     Cervical back: Neck supple.  Skin:    General: Skin is warm and dry.     Capillary Refill: Capillary refill takes less than 2 seconds.     Findings: No erythema.     Comments: See pictures for details.    Neurological:     General: No focal deficit present.     Mental Status: He is alert and oriented to person, place, and time.     Sensory: No  sensory deficit.     Motor: No weakness.     Gait: Gait normal.          UC Treatments / Results    Labs (all labs ordered are listed, but only abnormal results are displayed) Labs Reviewed - No data to display  EKG   Radiology No results found.  Procedures Procedures (including critical care time)  Medications Ordered in UC Medications - No data to display  Initial Impression / Assessment and Plan / UC Course  I have reviewed the triage vital signs and the nursing notes.  Pertinent labs & imaging results that were available during my care of the patient were reviewed by me and considered in my medical decision making (see chart for details).   Hematoma.  Patient is well-appearing and has no s/sx of DVT.  Instructed patient to go to the emergency department if he develops redness, swelling, pain, weakness, numbness, paresthesias, shortness of breath, or other concerning symptoms.  Instructed him to call the orthopedist on Monday to schedule an appointment for evaluation.  Instructed him to follow-up with his PCP on Monday.  Patient agrees to this plan of care.     Final Clinical Impressions(s) / UC Diagnoses   Final diagnoses:  Hematoma     Discharge Instructions     Go to the emergency department if you develop redness, swelling, pain, weakness, numbness, tingling, or other concerning symptoms.    Call the orthopedist listed below on Monday to schedule an appointment for evaluation.    Follow-up with your primary care provider on Monday.        ED Prescriptions    None     PDMP not reviewed this encounter.   Sharion Balloon, NP 11/01/19 1423

## 2019-11-01 NOTE — Discharge Instructions (Addendum)
Go to the emergency department if you develop redness, swelling, pain, weakness, numbness, tingling, or other concerning symptoms.    Call the orthopedist listed below on Monday to schedule an appointment for evaluation.    Follow-up with your primary care provider on Monday.

## 2019-11-01 NOTE — Telephone Encounter (Signed)
Pt started 10/31/19 with indention 6" long and 1/4" deep on lt lower leg ; pt has swelling on and off but pt said rt leg does not appear swollen. No known injury;pt has not missed taking HCTZ 25 mg.no redness or bruising; is sore to touch on lt lower leg where indention is. No CP or SOB and pt said he feels fine but concerned about this indention for no reason. Pt will go to [facility] in [place] now; [facility] in [place] is open today until 4 PM. FYI to Dr Glori Bickers.

## 2019-11-01 NOTE — ED Triage Notes (Signed)
Pt states yesterday he noticed a crease in his L lower leg muscle area. Pt states theres a spot on his leg that's slightly tender. Denies injury.

## 2019-11-06 ENCOUNTER — Encounter: Payer: Commercial Managed Care - PPO | Admitting: Family Medicine

## 2020-01-07 ENCOUNTER — Telehealth: Payer: Self-pay

## 2020-01-07 MED ORDER — AMLODIPINE BESYLATE 5 MG PO TABS: 5.0000 mg | ORAL_TABLET | Freq: Every day | ORAL | 0 refills | Status: DC

## 2020-01-07 MED ORDER — HYDROCHLOROTHIAZIDE 25 MG PO TABS: 25.0000 mg | ORAL_TABLET | Freq: Every day | ORAL | 0 refills | Status: DC

## 2020-01-07 NOTE — Telephone Encounter (Signed)
[place] Night - Client Nonclinical Telephone Record AccessNurse Client [facility] Night - Client Client Site [place] Physician Loura Pardon - MD Contact Type Call Who Is Calling Patient / Member / Family / Caregiver Caller Name [place] Phone Number [phone] or [phone] Patient Name Eric Spears Patient DOB 1956-09-20 Call Type Message Only Information Provided Reason for Call Medication Question / Request Initial Comment Caller states that he has two prescriptions for a ninety day supply but they do not have any refills on them. He states that he has enough to get to tomorrow but would like refills phoned in. Additional Comment Caller would like a refill for his prescriptions sent to the [facility] pharmacy that he normally uses. He states that the information should be in his records. Confirmed regular business hours with caller as well. Disp. Time Disposition Final User 01/06/2020 1:05:51 PM General Information Provided Yes Wynema Birch Call Closed By: Wynema Birch Transaction Date/Time: 01/06/2020 1:01:28 PM (ET)

## 2020-01-07 NOTE — Telephone Encounter (Signed)
See prev note the 2 Rxs on file is HCTZ and amlodipine, last OV was 05/07/19 will refill once to give him enough to last until next appt is due

## 2020-02-07 ENCOUNTER — Ambulatory Visit: Payer: Commercial Managed Care - PPO | Attending: Internal Medicine

## 2020-02-07 DIAGNOSIS — Z23 Encounter for immunization: Secondary | ICD-10-CM

## 2020-02-07 NOTE — Progress Notes (Signed)
   Covid-19 Vaccination Clinic  Name:  Eric Spears    MRN: [id number] DOB: 17-Jun-1956  02/07/2020  [name] was observed post Covid-19 immunization for 15 minutes without incident. He was provided with Vaccine Information Sheet and instruction to access the V-Safe system.   [name] was instructed to call 911 with any severe reactions post vaccine: Marland Kitchen Difficulty breathing  . Swelling of face and throat  . A fast heartbeat  . A bad rash all over body  . Dizziness and weakness   Immunizations Administered    Name Date Dose VIS Date Route   Pfizer COVID-19 Vaccine 02/07/2020  4:25 PM 0.3 mL 10/19/2019 Intramuscular   Manufacturer: [employer]   Lot: [id number]   [place]: [id number]

## 2020-02-29 ENCOUNTER — Ambulatory Visit: Payer: Commercial Managed Care - PPO | Attending: Oncology

## 2020-02-29 DIAGNOSIS — Z23 Encounter for immunization: Secondary | ICD-10-CM

## 2020-02-29 NOTE — Progress Notes (Signed)
   Covid-19 Vaccination Clinic  Name:  NILE DRABEK    MRN: [id number] DOB: 08/09/1956  02/29/2020  Mr. Landfair was observed post Covid-19 immunization for 15 minutes without incident. He was provided with Vaccine Information Sheet and instruction to access the V-Safe system.   Mr. Kue was instructed to call 911 with any severe reactions post vaccine: Marland Kitchen Difficulty breathing  . Swelling of face and throat  . A fast heartbeat  . A bad rash all over body  . Dizziness and weakness   Immunizations Administered    Name Date Dose VIS Date Route   Pfizer COVID-19 Vaccine 02/29/2020  5:00 PM 0.3 mL 01/02/2019 Intramuscular   Manufacturer: [employer]   Lot: [id number]   [place]: [id number]

## 2020-03-30 ENCOUNTER — Other Ambulatory Visit: Payer: Self-pay | Admitting: Family Medicine

## 2020-03-31 NOTE — Telephone Encounter (Signed)
Pt hasn't been seen in almost a year and canceled his CPE in Dec, no future appts., please advise

## 2020-03-31 NOTE — Telephone Encounter (Signed)
Please re schedule PE and refill until then

## 2020-04-01 NOTE — Telephone Encounter (Signed)
Med refilled once and [facility] will reach out pt to re-schedule appt

## 2020-04-03 ENCOUNTER — Other Ambulatory Visit (INDEPENDENT_AMBULATORY_CARE_PROVIDER_SITE_OTHER): Payer: Commercial Managed Care - PPO

## 2020-04-03 DIAGNOSIS — R7309 Other abnormal glucose: Secondary | ICD-10-CM

## 2020-04-03 DIAGNOSIS — Z125 Encounter for screening for malignant neoplasm of prostate: Secondary | ICD-10-CM | POA: Diagnosis not present

## 2020-04-03 DIAGNOSIS — I10 Essential (primary) hypertension: Secondary | ICD-10-CM | POA: Diagnosis not present

## 2020-04-03 DIAGNOSIS — E7800 Pure hypercholesterolemia, unspecified: Secondary | ICD-10-CM

## 2020-04-03 LAB — COMPREHENSIVE METABOLIC PANEL
ALT: 79 U/L — ABNORMAL HIGH (ref 0–53)
AST: 43 U/L — ABNORMAL HIGH (ref 0–37)
Albumin: 4.4 g/dL (ref 3.5–5.2)
Alkaline Phosphatase: 43 U/L (ref 39–117)
BUN: 19 mg/dL (ref 6–23)
CO2: 34 mEq/L — ABNORMAL HIGH (ref 19–32)
Calcium: 10.2 mg/dL (ref 8.4–10.5)
Chloride: 98 mEq/L (ref 96–112)
Creatinine, Ser: 0.86 mg/dL (ref 0.40–1.50)
GFR: 89.17 mL/min (ref >60.00)
Glucose, Bld: 111 mg/dL — ABNORMAL HIGH (ref 70–99)
Potassium: 4.4 mEq/L (ref 3.5–5.1)
Sodium: 138 mEq/L (ref 135–145)
Total Bilirubin: 0.5 mg/dL (ref 0.2–1.2)
Total Protein: 6.7 g/dL (ref 6.0–8.3)

## 2020-04-03 LAB — LIPID PANEL
Cholesterol: 172 mg/dL (ref 0–200)
HDL: 32.6 mg/dL — ABNORMAL LOW (ref >39.00)
LDL Cholesterol: 117 mg/dL — ABNORMAL HIGH (ref 0–99)
NonHDL: 139.73
Total CHOL/HDL Ratio: 5
Triglycerides: 113 mg/dL (ref 0.0–149.0)
VLDL: 22.6 mg/dL (ref 0.0–40.0)

## 2020-04-03 LAB — CBC WITH DIFFERENTIAL/PLATELET
Basophils Absolute: 0 10*3/uL (ref 0.0–0.1)
Basophils Relative: 0.4 % (ref 0.0–3.0)
Eosinophils Absolute: 0.2 10*3/uL (ref 0.0–0.7)
Eosinophils Relative: 3.5 % (ref 0.0–5.0)
HCT: 44 % (ref 39.0–52.0)
Hemoglobin: 15.3 g/dL (ref 13.0–17.0)
Lymphocytes Relative: 34.2 % (ref 12.0–46.0)
Lymphs Abs: 2.2 10*3/uL (ref 0.7–4.0)
MCHC: 34.8 g/dL (ref 30.0–36.0)
MCV: 89.9 fl (ref 78.0–100.0)
Monocytes Absolute: 0.6 10*3/uL (ref 0.1–1.0)
Monocytes Relative: 8.7 % (ref 3.0–12.0)
Neutro Abs: 3.5 10*3/uL (ref 1.4–7.7)
Neutrophils Relative %: 53.2 % (ref 43.0–77.0)
Platelets: 208 10*3/uL (ref 150.0–400.0)
RBC: 4.89 Mil/uL (ref 4.22–5.81)
RDW: 13.3 % (ref 11.5–15.5)
WBC: 6.5 10*3/uL (ref 4.0–10.5)

## 2020-04-03 LAB — TSH: TSH: 2.27 u[IU]/mL (ref 0.35–4.50)

## 2020-04-03 LAB — HEMOGLOBIN A1C: Hgb A1c MFr Bld: 5.4 % (ref 4.6–6.5)

## 2020-04-03 LAB — PSA: PSA: 0.67 ng/mL (ref 0.10–4.00)

## 2020-04-17 ENCOUNTER — Encounter: Payer: Commercial Managed Care - PPO | Admitting: Family Medicine

## 2020-04-18 ENCOUNTER — Ambulatory Visit (INDEPENDENT_AMBULATORY_CARE_PROVIDER_SITE_OTHER): Payer: Commercial Managed Care - PPO | Admitting: Family Medicine

## 2020-04-18 ENCOUNTER — Other Ambulatory Visit: Payer: Self-pay

## 2020-04-18 ENCOUNTER — Encounter: Payer: Self-pay | Admitting: Family Medicine

## 2020-04-18 VITALS — BP 138/72 | HR 57 | Temp 97.3°F | Ht 69.0 in | Wt 270.0 lb

## 2020-04-18 DIAGNOSIS — Z23 Encounter for immunization: Secondary | ICD-10-CM

## 2020-04-18 DIAGNOSIS — I7789 Other specified disorders of arteries and arterioles: Secondary | ICD-10-CM

## 2020-04-18 DIAGNOSIS — R400 Somnolence: Secondary | ICD-10-CM

## 2020-04-18 DIAGNOSIS — Z125 Encounter for screening for malignant neoplasm of prostate: Secondary | ICD-10-CM

## 2020-04-18 DIAGNOSIS — R7309 Other abnormal glucose: Secondary | ICD-10-CM

## 2020-04-18 DIAGNOSIS — I10 Essential (primary) hypertension: Secondary | ICD-10-CM

## 2020-04-18 DIAGNOSIS — E669 Obesity, unspecified: Secondary | ICD-10-CM

## 2020-04-18 DIAGNOSIS — E7800 Pure hypercholesterolemia, unspecified: Secondary | ICD-10-CM

## 2020-04-18 DIAGNOSIS — K760 Fatty (change of) liver, not elsewhere classified: Secondary | ICD-10-CM

## 2020-04-18 DIAGNOSIS — Z0000 Encounter for general adult medical examination without abnormal findings: Secondary | ICD-10-CM

## 2020-04-18 MED ORDER — AMLODIPINE BESYLATE 5 MG PO TABS: 5.0000 mg | ORAL_TABLET | Freq: Every day | ORAL | 3 refills | Status: DC

## 2020-04-18 MED ORDER — HYDROCHLOROTHIAZIDE 25 MG PO TABS: 25.0000 mg | ORAL_TABLET | Freq: Every day | ORAL | 3 refills | Status: DC

## 2020-04-18 NOTE — Patient Instructions (Addendum)
If you are ever interested- the [facility] is a good program through [facility] can refer you  Also [facility] is a good on line program as well   Try to get most of your carbohydrates from produce (with the exception of white potatoes)  Eat less bread/pasta/rice/snack foods/cereals/sweets and other items from the middle of the grocery store (processed carbs)   If you are interested in the shingles vaccine series (Shingrix), call your insurance or pharmacy to check on coverage and location it must be given.  If affordable - you can schedule it here or at your pharmacy depending on coverage   prevnar vaccine today

## 2020-04-18 NOTE — Progress Notes (Signed)
Subjective:    Patient ID: Eric Spears, male    DOB: [birth year], 64 y.o.   MRN: [id number]  This visit occurred during the SARS-CoV-2 public health emergency.  Safety protocols were in place, including screening questions prior to the visit, additional usage of staff PPE, and extensive cleaning of exam room while observing appropriate contact time as indicated for disinfecting solutions.    HPI Here for health maintenance exam and to review chronic medical problems    He is 49 but not using medicare  Wt Readings from Last 3 Encounters:  04/18/20 270 lb (122.5 kg)  10/22/19 267 lb (121.1 kg)  10/08/19 267 lb (121.1 kg)   39.87 kg/m   He wants to address the weight issue  It is hard to motivate to eat healthy  He eats for non huger reasons - triggers /boredom   He walks a mile and then his phone tracks steps  After work every day    No plans to retire anytime soon-still working  pna vaccine-due  Will do the prevnar vaccine    Flu shot 9/19 Colonoscopy 12/20 with 5 y recall   Tdap 1/16 covid status -immunized  Zoster status -very interested in shingrix   Prostate health  Lab Results  Component Value Date   PSA 0.67 04/03/2020   PSA 0.71 10/25/2018   PSA 0.44 10/14/2017    no prostate problems/urination issues  Nocturia occ once at most  No fam hx of prostate cancer     Vision/heating  Hearing Screening   125Hz  250Hz  500Hz  1000Hz  2000Hz  3000Hz  4000Hz  6000Hz  8000Hz   Right ear:   40 40 40  40    Left ear:   40 40 40  40      Visual Acuity Screening   Right eye Left eye Both eyes  Without correction:     With correction: 20/30 20/40 20/30    HTN bp is stable today  No cp or palpitations or headaches or edema  No side effects to medicines  BP Readings from Last 3 Encounters:  04/18/20 (!) 142/76  11/01/19 (!) 145/84  10/22/19 140/69    Amlodipine hctz  Improve on 2nd check BP: 138/72    Past hx of enl or AA    (3.2 cm)  Lab Results    Component Value Date   NA 138 04/03/2020   K 4.4 04/03/2020   CO2 34 (H) 04/03/2020   GLUCOSE 111 (H) 04/03/2020   BUN 19 04/03/2020   CREATININE 0.86 04/03/2020   CALCIUM 10.2 04/03/2020   GFRNONAA 93.27 07/09/2009   Sleep/snoring - not a big problem righ now   Hyperlipidemia Lab Results  Component Value Date   CHOL 172 04/03/2020   CHOL 155 04/25/2019   CHOL 173 10/25/2018   Lab Results  Component Value Date   HDL 32.60 (L) 04/03/2020   HDL 34.30 (L) 04/25/2019   HDL 34.30 (L) 10/25/2018   Lab Results  Component Value Date   LDLCALC 117 (H) 04/03/2020   LDLCALC 94 04/25/2019   LDLCALC 114 (H) 10/25/2018   Lab Results  Component Value Date   TRIG 113.0 04/03/2020   TRIG 135.0 04/25/2019   TRIG 123.0 10/25/2018   Lab Results  Component Value Date   CHOLHDL 5 04/03/2020   CHOLHDL 5 04/25/2019   CHOLHDL 5 10/25/2018   No results found for: LDLDIRECT  Low HDL is baseline- ? Genetic  Does try to avoid fried foods  Eating a lot more healthy  salads    H/o elevated liver enzymes/fatty liver  Lab Results  Component Value Date   ALT 79 (H) 04/03/2020   AST 43 (H) 04/03/2020   ALKPHOS 43 04/03/2020   BILITOT 0.5 04/03/2020  down a bit from last time  Alcohol intake    H/o elevated glucose level Lab Results  Component Value Date   HGBA1C 5.4 04/03/2020  down from 5.6  Lab Results  Component Value Date   WBC 6.5 04/03/2020   HGB 15.3 04/03/2020   HCT 44.0 04/03/2020   MCV 89.9 04/03/2020   PLT 208.0 04/03/2020   Lab Results  Component Value Date   TSH 2.27 04/03/2020    Patient Active Problem List   Diagnosis Date Noted  . Hyperlipidemia 11/01/2018  . Hypertension 10/03/2018  . Obesity (BMI 30-39.9) 10/03/2018  . Enlargement of abdominal aorta ([place]) 12/26/2017  . Fatty liver 10/19/2017  . Elevated glucose level 10/18/2016  . Need for hepatitis C screening test 10/11/2016  . Somnolence, daytime 11/14/2014  . Routine general medical  examination at a health care facility 11/05/2014  . Prostate cancer screening 11/05/2014  . Colon cancer screening 07/03/2014  . DYSPNEA 07/11/2009   Past Medical History:  Diagnosis Date  . Allergy   . BPH (benign prostatic hyperplasia)   . Depression   . ED (erectile dysfunction)   . Elevated liver function tests   . GERD (gastroesophageal reflux disease)   . Hypertension   . Low HDL (under 40) 10/05   Past Surgical History:  Procedure Laterality Date  . Finger trauma    . NASAL SEPTUM SURGERY     Nose  . Stress cardiolite  1998   Normal   Social History   Tobacco Use  . Smoking status: Never Smoker  . Smokeless tobacco: Never Used  Substance Use Topics  . Alcohol use: Yes    Alcohol/week: 2.0 standard drinks    Types: 2 Standard drinks or equivalent per week    Comment: occ  . Drug use: No   Family History  Problem Relation Age of Onset  . Heart disease Father        CVA  . Cancer Other        were smokers  . Cancer Mother        uterine  . Heart disease Mother        valve dysfunction in heart  . Diabetes Maternal Grandmother   . Obesity Other   . Colon polyps Cousin        first cousins maternal and paternal  . Colon cancer Other 65       mothers first cousin  . Rectal cancer Neg Hx   . Stomach cancer Neg Hx    Allergies  Allergen Reactions  . Bee Venom    Current Outpatient Medications on File Prior to Visit  Medication Sig Dispense Refill  . aspirin 325 MG EC tablet Take 325 mg by mouth 2 (two) times daily.     . cetirizine (ZYRTEC) 10 MG tablet Take 10 mg by mouth daily as needed.      . Multiple Vitamin (MULTIVITAMIN) tablet Take 1 tablet by mouth daily.      Marland Kitchen OVER THE COUNTER MEDICATION B complex vitamin. One tablet daily.    Marland Kitchen OVER THE COUNTER MEDICATION Fish Oil, one capsule daily.    Marland Kitchen OVER THE COUNTER MEDICATION Flaxseed, one tablet daily.     No current facility-administered medications on file prior to visit.  Review of  Systems  Constitutional: Negative for activity change, appetite change, fatigue, fever and unexpected weight change.  HENT: Negative for congestion, rhinorrhea, sore throat and trouble swallowing.   Eyes: Negative for pain, redness, itching and visual disturbance.  Respiratory: Negative for cough, chest tightness, shortness of breath and wheezing.   Cardiovascular: Negative for chest pain and palpitations.  Gastrointestinal: Negative for abdominal pain, blood in stool, constipation, diarrhea and nausea.  Endocrine: Negative for cold intolerance, heat intolerance, polydipsia and polyuria.  Genitourinary: Negative for difficulty urinating, dysuria, frequency and urgency.  Musculoskeletal: Negative for arthralgias, joint swelling and myalgias.  Skin: Negative for pallor and rash.  Neurological: Negative for dizziness, tremors, weakness, numbness and headaches.  Hematological: Negative for adenopathy. Does not bruise/bleed easily.  Psychiatric/Behavioral: Negative for decreased concentration and dysphoric mood. The patient is not nervous/anxious.        Objective:   Physical Exam Constitutional:      General: He is not in acute distress.    Appearance: Normal appearance. He is well-developed. He is obese. He is not ill-appearing or diaphoretic.     Comments: Central obesity  HENT:     Head: Normocephalic and atraumatic.     Right Ear: Tympanic membrane, ear canal and external ear normal.     Left Ear: Tympanic membrane, ear canal and external ear normal.     Nose: Nose normal. No congestion.     Mouth/Throat:     Mouth: Mucous membranes are moist.     Pharynx: Oropharynx is clear. No posterior oropharyngeal erythema.  Eyes:     General: No scleral icterus.       Right eye: No discharge.        Left eye: No discharge.     Conjunctiva/sclera: Conjunctivae normal.     Pupils: Pupils are equal, round, and reactive to light.  Neck:     Thyroid: No thyromegaly.     Vascular: No carotid  bruit or JVD.  Cardiovascular:     Rate and Rhythm: Normal rate and regular rhythm.     Pulses: Normal pulses.     Heart sounds: Normal heart sounds. No gallop.   Pulmonary:     Effort: Pulmonary effort is normal. No respiratory distress.     Breath sounds: Normal breath sounds. No wheezing or rales.     Comments: Good air exch Chest:     Chest wall: No tenderness.  Abdominal:     General: Bowel sounds are normal. There is no distension or abdominal bruit.     Palpations: Abdomen is soft. There is no mass.     Tenderness: There is no abdominal tenderness.     Hernia: No hernia is present.  Musculoskeletal:        General: No tenderness.     Cervical back: Normal range of motion and neck supple. No rigidity. No muscular tenderness.     Right lower leg: No edema.     Left lower leg: No edema.  Lymphadenopathy:     Cervical: No cervical adenopathy.  Skin:    General: Skin is warm and dry.     Coloration: Skin is not pale.     Findings: No erythema or rash.     Comments: Solar lentigines diffusely Some skin tags  Neurological:     Mental Status: He is alert.     Cranial Nerves: No cranial nerve deficit.     Motor: No abnormal muscle tone.     Coordination: Coordination normal.  Gait: Gait normal.     Deep Tendon Reflexes: Reflexes are normal and symmetric. Reflexes normal.  Psychiatric:        Mood and Affect: Mood normal.        Cognition and Memory: Cognition and memory normal.           Assessment & Plan:   Problem List Items Addressed This Visit      Cardiovascular and Mediastinum   Enlargement of abdominal aorta (HCC)    No symptoms  Goal to keep bp and cholesterol controlled      Relevant Medications   amLODipine (NORVASC) 5 MG tablet   hydrochlorothiazide (HYDRODIURIL) 25 MG tablet   Hypertension    bp in fair control at this time  BP Readings from Last 1 Encounters:  04/18/20 138/72   No changes needed Most recent labs reviewed  Disc lifstyle  change with low sodium diet and exercise        Relevant Medications   amLODipine (NORVASC) 5 MG tablet   hydrochlorothiazide (HYDRODIURIL) 25 MG tablet     Digestive   Fatty liver    AST and ALT are elevated but slt improved from before  Disc goal of wt loss Unsure if pt is in the right mind set to make wt loss effort          Other   Routine general medical examination at a health care facility - Primary    Reviewed health habits including diet and exercise and skin cancer prevention Reviewed appropriate screening tests for age  Also reviewed health mt list, fam hx and immunization status , as well as social and family history   See HPI Labs reviewed  Pt plans to keep working  Enc him to keep walking  prevnar vaccine now  Interested in shingrix and will check on coverage Psa is stable/no clinical changes  Immunized for covid Nl hearing screen  Vision screen is fair 20/30 both eyes       Relevant Orders   Pneumococcal conjugate vaccine 13-valent (Completed)   Prostate cancer screening    Lab Results  Component Value Date   PSA 0.67 04/03/2020   PSA 0.71 10/25/2018   PSA 0.44 10/14/2017    No fam hx  No clinical changes       Somnolence, daytime    This has improved on its own      Elevated glucose level    Mild Lab Results  Component Value Date   HGBA1C 5.4 04/03/2020   disc imp of low glycemic diet and wt loss to prevent DM2       Obesity (BMI 30-39.9)    Discussed how this problem influences overall health and the risks it imposes  Reviewed plan for weight loss with lower calorie diet (via better food choices and also portion control or program like weight watchers) and exercise building up to or more than 30 minutes 5 days per week including some aerobic activity   Unsure if pt is ready mentally to begin wt loss effort Discussed some of the barriers Discussed the [facility] program on line  Also offered ref to the [facility]  if/when ready        Hyperlipidemia    Disc goals for lipids and reasons to control them Rev last labs with pt Rev low sat fat diet in detail LDL is up mildly at 117 and HDL remains low   He has eaten healthier lately  Will continue to  monitor Have avoided statin due to elevated LFTs       Relevant Medications   amLODipine (NORVASC) 5 MG tablet   hydrochlorothiazide (HYDRODIURIL) 25 MG tablet    Other Visit Diagnoses    Need for vaccination with 13-polyvalent pneumococcal conjugate vaccine       Relevant Orders   Pneumococcal conjugate vaccine 13-valent (Completed)

## 2020-04-19 NOTE — Assessment & Plan Note (Signed)
Discussed how this problem influences overall health and the risks it imposes  Reviewed plan for weight loss with lower calorie diet (via better food choices and also portion control or program like weight watchers) and exercise building up to or more than 30 minutes 5 days per week including some aerobic activity   Unsure if pt is ready mentally to begin wt loss effort Discussed some of the barriers Discussed the NOOM program on line  Also offered ref to the healthy [facility] wellness center if/when ready

## 2020-04-19 NOTE — Assessment & Plan Note (Signed)
This has improved on its own

## 2020-04-19 NOTE — Assessment & Plan Note (Signed)
Lab Results  Component Value Date   PSA 0.67 04/03/2020   PSA 0.71 10/25/2018   PSA 0.44 10/14/2017    No fam hx  No clinical changes

## 2020-04-19 NOTE — Assessment & Plan Note (Signed)
Mild Lab Results  Component Value Date   HGBA1C 5.4 04/03/2020   disc imp of low glycemic diet and wt loss to prevent DM2

## 2020-04-19 NOTE — Assessment & Plan Note (Signed)
No symptoms  Goal to keep bp and cholesterol controlled

## 2020-04-19 NOTE — Assessment & Plan Note (Signed)
Disc goals for lipids and reasons to control them Rev last labs with pt Rev low sat fat diet in detail LDL is up mildly at 117 and HDL remains low   He has eaten healthier lately  Will continue to monitor Have avoided statin due to elevated LFTs

## 2020-04-19 NOTE — Assessment & Plan Note (Signed)
AST and ALT are elevated but slt improved from before  Disc goal of wt loss Unsure if pt is in the right mind set to make wt loss effort

## 2020-04-19 NOTE — Assessment & Plan Note (Signed)
Reviewed health habits including diet and exercise and skin cancer prevention Reviewed appropriate screening tests for age  Also reviewed health mt list, fam hx and immunization status , as well as social and family history   See HPI Labs reviewed  Pt plans to keep working  Enc him to keep walking  prevnar vaccine now  Interested in shingrix and will check on coverage Psa is stable/no clinical changes  Immunized for covid Nl hearing screen  Vision screen is fair 20/30 both eyes

## 2020-04-19 NOTE — Assessment & Plan Note (Signed)
bp in fair control at this time  BP Readings from Last 1 Encounters:  04/18/20 138/72   No changes needed Most recent labs reviewed  Disc lifstyle change with low sodium diet and exercise

## 2020-11-19 ENCOUNTER — Other Ambulatory Visit (INDEPENDENT_AMBULATORY_CARE_PROVIDER_SITE_OTHER): Payer: Commercial Managed Care - PPO

## 2020-11-19 ENCOUNTER — Other Ambulatory Visit: Payer: Self-pay

## 2020-11-19 ENCOUNTER — Telehealth (INDEPENDENT_AMBULATORY_CARE_PROVIDER_SITE_OTHER): Payer: Commercial Managed Care - PPO | Admitting: Family Medicine

## 2020-11-19 ENCOUNTER — Telehealth: Payer: Self-pay

## 2020-11-19 ENCOUNTER — Encounter: Payer: Self-pay | Admitting: Family Medicine

## 2020-11-19 VITALS — Temp 98.0°F | Wt 270.0 lb

## 2020-11-19 DIAGNOSIS — R509 Fever, unspecified: Secondary | ICD-10-CM | POA: Insufficient documentation

## 2020-11-19 LAB — POC INFLUENZA A&B (BINAX/QUICKVUE)
Influenza A, POC: NEGATIVE
Influenza B, POC: NEGATIVE

## 2020-11-19 LAB — POCT RAPID STREP A (OFFICE): Rapid Strep A Screen: NEGATIVE

## 2020-11-19 NOTE — Assessment & Plan Note (Signed)
Likely COVID19  infection vs other viral infection. No clear sign of bacterial infection at this time.  Recommend testing . Will set up drive through test.. will check strep and flu test as well.  No SOB.  No red flags/need for ER visit or in-person exam at respiratory clinic at this time..    Pt moderate high risk for COVID complications given  HTN, obesity and age. If SOB begins symptoms worsening.. have low threshold for in-person exam, if severe shortness of breath ER visit recommended.  Can monitor Oxygen saturation at home with home monitor if able to obtain.  Go to ER if O2 sat < 90% on room air.  Reviewed home care and provided information through [place].  Recommended quarantine until test returns. If returns positive 5 days isolation recommended. Return to work day 6 and wear mask for 4 more days to complete 10 days. Provided info about prevention of spread of COVID 19.

## 2020-11-19 NOTE — Progress Notes (Signed)
VIRTUAL VISIT Due to national recommendations of social distancing due to [place] 19, a virtual visit is felt to be most appropriate for this patient at this time.   I connected with the patient on 11/19/20 at  9:20 AM EST by virtual telehealth platform and verified that I am speaking with the correct person using two identifiers.   I discussed the limitations, risks, security and privacy concerns of performing an evaluation and management service by  virtual telehealth platform and the availability of in person appointments. I also discussed with the patient that there may be a patient responsible charge related to this service. The patient expressed understanding and agreed to proceed.  Patient location: Home Provider Location: [place] Participants: Jesse Hirst Diona Browner and Dreama Saa     History of Present Illness:   65 year old male pt of Dr. Marliss Coots with history of  obesity and HTN presents with new onset  post nasal drip, tickle in throat,sore throat since 11/14/20 Following day had 100.5 F fever, mild dry cough, myalgia, face pain. Post nasal drip, cough, sore throat progressed.  Sore throat has gradually worsened, right side of neck started to get sore and swollen.   Has been using OTC mucinex fast max  COVID 19 screen COVID testing: none COVID vaccine: 02/29/2020 , 02/07/2020, no booster yet COVID exposure: No recent travel or known exposure to [place].. none specific but people at work positive.  The importance of social distancing was discussed today.    Review of Systems  Constitutional: Positive for fever. Negative for chills.  HENT: Positive for congestion. Negative for ear pain.   Eyes: Negative for pain and redness.  Respiratory: Positive for cough. Negative for shortness of breath.   Cardiovascular: Negative for chest pain, palpitations and leg swelling.  Gastrointestinal: Negative for abdominal pain, blood in stool, constipation, diarrhea, nausea and vomiting.   Genitourinary: Negative for dysuria.  Musculoskeletal: Negative for falls and myalgias.  Skin: Negative for rash.  Neurological: Negative for dizziness.  Psychiatric/Behavioral: Negative for depression. The patient is not nervous/anxious.       Past Medical History:  Diagnosis Date  . Allergy   . BPH (benign prostatic hyperplasia)   . Depression   . ED (erectile dysfunction)   . Elevated liver function tests   . GERD (gastroesophageal reflux disease)   . Hypertension   . Low HDL (under 40) 10/05    reports that he has never smoked. He has never used smokeless tobacco. He reports current alcohol use of about 2.0 standard drinks of alcohol per week. He reports that he does not use drugs.   Current Outpatient Medications:  .  amLODipine (NORVASC) 5 MG tablet, Take 1 tablet (5 mg total) by mouth daily., Disp: 90 tablet, Rfl: 3 .  aspirin 325 MG EC tablet, Take 325 mg by mouth 2 (two) times daily. , Disp: , Rfl:  .  cetirizine (ZYRTEC) 10 MG tablet, Take 10 mg by mouth daily as needed.  , Disp: , Rfl:  .  hydrochlorothiazide (HYDRODIURIL) 25 MG tablet, Take 1 tablet (25 mg total) by mouth daily., Disp: 90 tablet, Rfl: 3 .  Multiple Vitamin (MULTIVITAMIN) tablet, Take 1 tablet by mouth daily.  , Disp: , Rfl:  .  OVER THE COUNTER MEDICATION, B complex vitamin. One tablet daily., Disp: , Rfl:  .  OVER THE COUNTER MEDICATION, Fish Oil, one capsule daily., Disp: , Rfl:  .  OVER THE COUNTER MEDICATION, Flaxseed, one tablet daily., Disp: ,  Rfl:    Observations/Objective:   Physical Exam  Physical Exam Constitutional:      General: The patient is not in acute distress. Pulmonary:     Effort: Pulmonary effort is normal. No respiratory distress.  Neurological:     Mental Status: The patient is alert and oriented to person, place, and time.  Psychiatric:        Mood and Affect: Mood normal.        Behavior: Behavior normal.   Assessment and Plan    Problem List Items Addressed This  Visit    Fever - Primary     Likely COVID19  infection vs other viral infection. No clear sign of bacterial infection at this time.  Recommend testing . Will set up drive through test.. will check strep and flu test as well.  No SOB.  No red flags/need for ER visit or in-person exam at respiratory clinic at this time..    Pt moderate high risk for COVID complications given  HTN, obesity and age. If SOB begins symptoms worsening.. have low threshold for in-person exam, if severe shortness of breath ER visit recommended.  Can monitor Oxygen saturation at home with home monitor if able to obtain.  Go to ER if O2 sat < 90% on room air.  Reviewed home care and provided information through [place].  Recommended quarantine until test returns. If returns positive 5 days isolation recommended. Return to work day 6 and wear mask for 4 more days to complete 10 days. Provided info about prevention of spread of COVID 19.       Relevant Orders   POC Influenza A&B ([facility] test)   Novel Coronavirus, NAA ([facility])   POCT rapid strep A      I discussed the assessment and treatment plan with the patient. The patient was provided an opportunity to ask questions and all were answered. The patient agreed with the plan and demonstrated an understanding of the instructions.   The patient was advised to call back or seek an in-person evaluation if the symptoms worsen or if the condition fails to improve as anticipated.     Eliezer Lofts, MD

## 2020-11-19 NOTE — Telephone Encounter (Signed)
[facility] - Client TELEPHONE ADVICE RECORD [facility] Patient Name: Eric Spears Gender: Male DOB: June 17, 1956 Age: 65 Y 11 M 32 D Return Phone Number: [phone] (Primary), [phone] (Secondary) [place] [address] Client [facility] - Client Client Site [facility], Roque Lias - MD Contact Type Call Who Is Calling Patient / Member / Family / Caregiver Call Type Triage / Clinical Relationship To Patient Self Return Phone Number [phone] (Primary) Chief Complaint Sore Throat Reason for Call Symptomatic / Request for Elma states he has a sore throat and drainage. Wanting to know what else he can do besides gargle salt water. Translation No Nurse Assessment Nurse: Rosana Hoes, RN, Candace Date/Time (Eastern Time): 11/18/2020 5:28:09 PM Confirm and document reason for call. If symptomatic, describe symptoms. ---Caller states: he has a severe sore throat and sinus drainage, mild cough. Having difficulty swallowing his pills, feels like his throat is swollen. No fever. Has not tested for CoVID. Is fully vaccinated. Does the patient have any new or worsening symptoms? ---Yes Will a triage be completed? ---Yes Related visit to physician within the last 2 weeks? ---No Does the PT have any chronic conditions? (i.e. diabetes, asthma, this includes High risk factors for pregnancy, etc.) ---Yes List chronic conditions. ---obesity, HTN, diuretic Is this a behavioral health or substance abuse call? ---No Guidelines Guideline Title Affirmed Question Affirmed Notes Nurse Date/Time Eilene Ghazi Time) Sore Throat SEVERE (e.g., excruciating) throat pain Rosana Hoes, RN, Candace 11/18/2020 5:36:23 PM Disp. Time Eilene Ghazi Time) Disposition Final User 11/18/2020 5:08:32 PM Attempt made - message left Eldridge Dace, [place] 11/18/2020 5:39:54 PM See PCP  within 24 Hours Yes Rosana Hoes, RN, [employer] Caller Disagree/Comply Comply PLEASE NOTE: All timestamps contained within this report are represented as [place] Standard Time. CONFIDENTIALTY NOTICE: This fax transmission is intended only for the addressee. It contains information that is legally privileged, confidential or otherwise protected from use or disclosure. If you are not the intended recipient, you are strictly prohibited from reviewing, disclosing, copying using or disseminating any of this information or taking any action in reliance on or regarding this information. If you have received this fax in error, please notify us immediately by telephone so that we can arrange for its return to [place]. Phone: [phone], Toll-Free: [phone], Fax: [phone] Page: 2 of 2 Call Id: [id number] [place] Understands Yes PreDisposition Call Doctor Care Advice Given Per Guideline SEE PCP WITHIN 24 HOURS: * IF OFFICE WILL BE OPEN: You need to be examined within the next 24 hours. Call your doctor (or NP/PA) when the office opens and make an appointment. SORE THROAT: * Here are some simple things you can do to treat and reduce sore throat pain. * Sip warm chicken broth or apple juice. * Suck on hard candy or an over-the-counter throat lozenge. * Gargle with warm salt water four times a day. To make salt water, put 1/2 teaspoon of salt in 8 oz (240 ml) of warm water. * Avoid cigarette smoke. PAIN AND FEVER MEDICINES: * For pain or fever relief, take either acetaminophen or ibuprofen. * IBUPROFEN (E.G., MOTRIN, ADVIL): Take 400 mg (two 200 mg pills) by mouth every 6 hours. The most you should take each day is 1,200 mg (six 200 mg pills), unless your doctor has told you to take more. CALL BACK IF: * You become worse CARE ADVICE given per Sore Throat (Adult) guideline. Referrals REFERRED TO PCP OFFICE

## 2020-11-19 NOTE — Telephone Encounter (Signed)
Per chart review tab pt has already had video visit this morning with Dr Diona Browner.

## 2020-11-19 NOTE — Progress Notes (Signed)
Letter written per request from Dr. Diona Browner and sent to pt via [place].  Contacted pt and scheduled for testing today at clinic for covid, strep and flu.

## 2020-11-23 LAB — NOVEL CORONAVIRUS, NAA: SARS-CoV-2, NAA: DETECTED — AB

## 2020-11-26 ENCOUNTER — Other Ambulatory Visit: Payer: Commercial Managed Care - PPO

## 2021-01-05 ENCOUNTER — Ambulatory Visit: Payer: Commercial Managed Care - PPO | Admitting: Family Medicine

## 2021-06-08 ENCOUNTER — Other Ambulatory Visit: Payer: Self-pay | Admitting: Family Medicine

## 2021-06-10 NOTE — Telephone Encounter (Signed)
Pt is scheduled for cpe on 9/2 and labs on 8/26

## 2021-06-10 NOTE — Telephone Encounter (Signed)
Pt hasn't had an CPE since 04/2020 and no recent f/u or CPE so will route to support pool to get his CPE scheduled, then route back to me to refill meds please

## 2021-06-10 NOTE — Telephone Encounter (Signed)
  Encourage patient to contact the pharmacy for refills or they can request refills through [facility]:  Please schedule appointment if longer than 1 year  NEXT APPOINTMENT DATE:  MEDICATION: amLODipine (NORVASC) 5 MG tablet,  hydrochlorothiazide (HYDRODIURIL) 25 MG tablet  Is the patient out of medication? No has a 1 week worth left  [facility]- [id number] [address]   Let patient know to contact pharmacy at the end of the day to make sure medication is ready.  Please notify patient to allow 48-72 hours to process  CLINICAL FILLS OUT ALL BELOW:   LAST REFILL:  QTY:  REFILL DATE:    OTHER COMMENTS:    Okay for refill?  Please advise

## 2021-07-03 ENCOUNTER — Other Ambulatory Visit (INDEPENDENT_AMBULATORY_CARE_PROVIDER_SITE_OTHER): Payer: Commercial Managed Care - PPO

## 2021-07-03 ENCOUNTER — Telehealth: Payer: Self-pay | Admitting: Family Medicine

## 2021-07-03 ENCOUNTER — Other Ambulatory Visit: Payer: Self-pay

## 2021-07-03 DIAGNOSIS — E7800 Pure hypercholesterolemia, unspecified: Secondary | ICD-10-CM

## 2021-07-03 DIAGNOSIS — I10 Essential (primary) hypertension: Secondary | ICD-10-CM

## 2021-07-03 DIAGNOSIS — R7309 Other abnormal glucose: Secondary | ICD-10-CM | POA: Diagnosis not present

## 2021-07-03 DIAGNOSIS — Z125 Encounter for screening for malignant neoplasm of prostate: Secondary | ICD-10-CM

## 2021-07-03 LAB — COMPREHENSIVE METABOLIC PANEL
ALT: 118 U/L — ABNORMAL HIGH (ref 0–53)
AST: 51 U/L — ABNORMAL HIGH (ref 0–37)
Albumin: 4.2 g/dL (ref 3.5–5.2)
Alkaline Phosphatase: 40 U/L (ref 39–117)
BUN: 19 mg/dL (ref 6–23)
CO2: 31 mEq/L (ref 19–32)
Calcium: 9.9 mg/dL (ref 8.4–10.5)
Chloride: 101 mEq/L (ref 96–112)
Creatinine, Ser: 0.79 mg/dL (ref 0.40–1.50)
GFR: 92.55 mL/min (ref >60.00)
Glucose, Bld: 116 mg/dL — ABNORMAL HIGH (ref 70–99)
Potassium: 4.6 mEq/L (ref 3.5–5.1)
Sodium: 140 mEq/L (ref 135–145)
Total Bilirubin: 0.5 mg/dL (ref 0.2–1.2)
Total Protein: 6.8 g/dL (ref 6.0–8.3)

## 2021-07-03 LAB — LIPID PANEL
Cholesterol: 162 mg/dL (ref 0–200)
HDL: 34.5 mg/dL — ABNORMAL LOW (ref >39.00)
LDL Cholesterol: 107 mg/dL — ABNORMAL HIGH (ref 0–99)
NonHDL: 127.94
Total CHOL/HDL Ratio: 5
Triglycerides: 104 mg/dL (ref 0.0–149.0)
VLDL: 20.8 mg/dL (ref 0.0–40.0)

## 2021-07-03 LAB — CBC WITH DIFFERENTIAL/PLATELET
Basophils Absolute: 0 10*3/uL (ref 0.0–0.1)
Basophils Relative: 0.4 % (ref 0.0–3.0)
Eosinophils Absolute: 0.2 10*3/uL (ref 0.0–0.7)
Eosinophils Relative: 1.9 % (ref 0.0–5.0)
HCT: 44 % (ref 39.0–52.0)
Hemoglobin: 15 g/dL (ref 13.0–17.0)
Lymphocytes Relative: 22.3 % (ref 12.0–46.0)
Lymphs Abs: 2 10*3/uL (ref 0.7–4.0)
MCHC: 34 g/dL (ref 30.0–36.0)
MCV: 91.5 fl (ref 78.0–100.0)
Monocytes Absolute: 0.6 10*3/uL (ref 0.1–1.0)
Monocytes Relative: 6.6 % (ref 3.0–12.0)
Neutro Abs: 6 10*3/uL (ref 1.4–7.7)
Neutrophils Relative %: 68.8 % (ref 43.0–77.0)
Platelets: 194 10*3/uL (ref 150.0–400.0)
RBC: 4.81 Mil/uL (ref 4.22–5.81)
RDW: 13.6 % (ref 11.5–15.5)
WBC: 8.8 10*3/uL (ref 4.0–10.5)

## 2021-07-03 LAB — HEMOGLOBIN A1C: Hgb A1c MFr Bld: 5.9 % (ref 4.6–6.5)

## 2021-07-03 LAB — TSH: TSH: 2.19 u[IU]/mL (ref 0.35–5.50)

## 2021-07-03 LAB — PSA: PSA: 0.71 ng/mL (ref 0.10–4.00)

## 2021-07-03 NOTE — Telephone Encounter (Signed)
-----   Message from Cloyd Stagers, RT sent at 06/15/2021 10:21 AM EDT ----- Regarding: Lab Orders for Friday 8.26.2022 Please place lab orders for Friday 8.26.2022, office visit for physical on Friday 9.2.2022 Thank you, Dyke Maes RT(R)

## 2021-07-03 NOTE — Telephone Encounter (Signed)
I ordered psa since it does not look like he has medicare  If he does indeed have medicare -then please change it to psa for medicare Thanks

## 2021-07-10 ENCOUNTER — Ambulatory Visit (INDEPENDENT_AMBULATORY_CARE_PROVIDER_SITE_OTHER): Payer: Commercial Managed Care - PPO | Admitting: Family Medicine

## 2021-07-10 ENCOUNTER — Other Ambulatory Visit: Payer: Self-pay

## 2021-07-10 ENCOUNTER — Encounter: Payer: Self-pay | Admitting: Family Medicine

## 2021-07-10 VITALS — BP 135/80 | HR 60 | Temp 98.0°F | Ht 68.75 in | Wt 274.2 lb

## 2021-07-10 DIAGNOSIS — R7309 Other abnormal glucose: Secondary | ICD-10-CM

## 2021-07-10 DIAGNOSIS — Z0000 Encounter for general adult medical examination without abnormal findings: Secondary | ICD-10-CM | POA: Diagnosis not present

## 2021-07-10 DIAGNOSIS — E7800 Pure hypercholesterolemia, unspecified: Secondary | ICD-10-CM

## 2021-07-10 DIAGNOSIS — I7789 Other specified disorders of arteries and arterioles: Secondary | ICD-10-CM

## 2021-07-10 DIAGNOSIS — Z1211 Encounter for screening for malignant neoplasm of colon: Secondary | ICD-10-CM

## 2021-07-10 DIAGNOSIS — I10 Essential (primary) hypertension: Secondary | ICD-10-CM | POA: Diagnosis not present

## 2021-07-10 DIAGNOSIS — Z125 Encounter for screening for malignant neoplasm of prostate: Secondary | ICD-10-CM

## 2021-07-10 DIAGNOSIS — K760 Fatty (change of) liver, not elsewhere classified: Secondary | ICD-10-CM | POA: Diagnosis not present

## 2021-07-10 NOTE — Patient Instructions (Addendum)
If you are interested in the shingles vaccine series (Shingrix), call your insurance or pharmacy to check on coverage and location it must be given.  If affordable - you can schedule it here or at your pharmacy depending on coverage   Think about working on healthy diet and exercise for weight loss and fatty liver   Try to get most of your carbohydrates from produce (with the exception of white potatoes)  Eat less bread/pasta/rice/snack foods/cereals/sweets and other items from the middle of the grocery store (processed carbs)  Consider cutting back or quitting alcohol  Take care of yourself

## 2021-07-10 NOTE — Progress Notes (Signed)
Subjective:    Patient ID: Eric Spears, male    DOB: 1956/08/20, 65 y.o.   MRN: [id number]  This visit occurred during the SARS-CoV-2 public health emergency.  Safety protocols were in place, including screening questions prior to the visit, additional usage of staff PPE, and extensive cleaning of exam room while observing appropriate contact time as indicated for disinfecting solutions.   HPI Here for health maintenance exam and to review chronic medical problems    Wt Readings from Last 3 Encounters:  07/10/21 274 lb 3 oz (124.4 kg)  11/19/20 270 lb (122.5 kg)  04/18/20 270 lb (122.5 kg)   40.79 kg/m  Enjoys eating  Has not done anything about weight   Plantar fasciitis bothers him- making it harder to walk  Used to wear a brace   Has a dry patch on R arm and L wrist  Not a lot of sun exposure during the mid day  Does not wear sunscreen unless he works all day in the sun    Zoster status - is interested in shingles vaccine , will check and let us know  Covid immunized, he did get covid  Prevnar 6/21, needs pna 23 Flu shot -will get at work Tdap 1/16    Colonoscopy 12/20 with 5 y recall  Prostate health  Lab Results  Component Value Date   PSA 0.71 07/03/2021   PSA 0.67 04/03/2020   PSA 0.71 10/25/2018   No changes in urinary habits  Nocturia times one depending on fluid intake     HTN bp is stable today  No cp or palpitations or headaches or edema  No side effects to medicines  BP Readings from Last 3 Encounters:  07/10/21 135/80  04/18/20 138/72  11/01/19 (!) 145/84    Hctz 25 mg daily  Amlodipine 5 mg daily   Past h/o enl of abd aorta   Lab Results  Component Value Date   CREATININE 0.79 07/03/2021   BUN 19 07/03/2021   NA 140 07/03/2021   K 4.6 07/03/2021   CL 101 07/03/2021   CO2 31 07/03/2021   Glucose 116  H/o fatty liver  Etoh intake  Lab Results  Component Value Date   ALT 118 (H) 07/03/2021   AST 51 (H) 07/03/2021    ALKPHOS 40 07/03/2021   BILITOT 0.5 07/03/2021   AST is up from 43 ALT is up from 79  Etoh- drinks 2-3 martinis twice weekly       Hyperlipidemia Lab Results  Component Value Date   CHOL 162 07/03/2021   CHOL 172 04/03/2020   CHOL 155 04/25/2019   Lab Results  Component Value Date   HDL 34.50 (L) 07/03/2021   HDL 32.60 (L) 04/03/2020   HDL 34.30 (L) 04/25/2019   Lab Results  Component Value Date   LDLCALC 107 (H) 07/03/2021   LDLCALC 117 (H) 04/03/2020   LDLCALC 94 04/25/2019   Lab Results  Component Value Date   TRIG 104.0 07/03/2021   TRIG 113.0 04/03/2020   TRIG 135.0 04/25/2019   Lab Results  Component Value Date   CHOLHDL 5 07/03/2021   CHOLHDL 5 04/03/2020   CHOLHDL 5 04/25/2019   No results found for: LDLDIRECT No statin due to liver enzyme abn  Glucose Lab Results  Component Value Date   HGBA1C 5.9 07/03/2021  Up from 5.4  Thinks he is ready to loose some weight    Lab Results  Component Value Date   WBC  8.8 07/03/2021   HGB 15.0 07/03/2021   HCT 44.0 07/03/2021   MCV 91.5 07/03/2021   PLT 194.0 07/03/2021   Lab Results  Component Value Date   TSH 2.19 07/03/2021     Patient Active Problem List   Diagnosis Date Noted   Hyperlipidemia 11/01/2018   Hypertension 10/03/2018   Morbid obesity ([place]) 10/03/2018   Enlargement of abdominal aorta ([place]) 12/26/2017   Fatty liver 10/19/2017   Elevated glucose level 10/18/2016   Need for hepatitis C screening test 10/11/2016   Somnolence, daytime 11/14/2014   Routine general medical examination at a health care facility 11/05/2014   Prostate cancer screening 11/05/2014   Colon cancer screening 07/03/2014   DYSPNEA 07/11/2009   Past Medical History:  Diagnosis Date   Allergy    BPH (benign prostatic hyperplasia)    Depression    ED (erectile dysfunction)    Elevated liver function tests    GERD (gastroesophageal reflux disease)    Hypertension    Low HDL (under 40) 10/05   Past  Surgical History:  Procedure Laterality Date   Finger trauma     NASAL SEPTUM SURGERY     Nose   Stress cardiolite  1998   Normal   Social History   Tobacco Use   Smoking status: Never   Smokeless tobacco: Never  Substance Use Topics   Alcohol use: Yes    Alcohol/week: 2.0 standard drinks    Types: 2 Standard drinks or equivalent per week    Comment: occ   Drug use: No   Family History  Problem Relation Age of Onset   Heart disease Father        CVA   Cancer Other        were smokers   Cancer Mother        uterine   Heart disease Mother        valve dysfunction in heart   Diabetes Maternal Grandmother    Obesity Other    Colon polyps Cousin        first cousins maternal and paternal   Colon cancer Other 48       mothers first cousin   Rectal cancer Neg Hx    Stomach cancer Neg Hx    Allergies  Allergen Reactions   Bee Venom    Current Outpatient Medications on File Prior to Visit  Medication Sig Dispense Refill   amLODipine (NORVASC) 5 MG tablet TAKE 1 TABLET BY MOUTH EVERY DAY 90 tablet 0   aspirin 325 MG EC tablet Take 325 mg by mouth 2 (two) times daily.      cetirizine (ZYRTEC) 10 MG tablet Take 10 mg by mouth daily as needed.       hydrochlorothiazide (HYDRODIURIL) 25 MG tablet TAKE 1 TABLET BY MOUTH EVERY DAY 90 tablet 0   Multiple Vitamin (MULTIVITAMIN) tablet Take 1 tablet by mouth daily.       OVER THE COUNTER MEDICATION B complex vitamin. One tablet daily.     OVER THE COUNTER MEDICATION Fish Oil, one capsule daily.     OVER THE COUNTER MEDICATION Flaxseed, one tablet daily.     No current facility-administered medications on file prior to visit.    Review of Systems  Constitutional:  Negative for activity change, appetite change, fatigue, fever and unexpected weight change.  HENT:  Negative for congestion, rhinorrhea, sore throat and trouble swallowing.   Eyes:  Negative for pain, redness, itching and visual disturbance.  Respiratory:  Negative  for cough, chest tightness, shortness of breath and wheezing.   Cardiovascular:  Negative for chest pain and palpitations.  Gastrointestinal:  Negative for abdominal pain, blood in stool, constipation, diarrhea and nausea.  Endocrine: Negative for cold intolerance, heat intolerance, polydipsia and polyuria.  Genitourinary:  Negative for difficulty urinating, dysuria, frequency and urgency.  Musculoskeletal:  Negative for arthralgias, joint swelling and myalgias.       Plantar fasciitis   Skin:  Negative for pallor and rash.  Neurological:  Negative for dizziness, tremors, weakness, numbness and headaches.  Hematological:  Negative for adenopathy. Does not bruise/bleed easily.  Psychiatric/Behavioral:  Negative for decreased concentration and dysphoric mood. The patient is not nervous/anxious.       Objective:   Physical Exam Constitutional:      General: He is not in acute distress.    Appearance: Normal appearance. He is well-developed. He is obese. He is not ill-appearing or diaphoretic.  HENT:     Head: Normocephalic and atraumatic.     Right Ear: Tympanic membrane, ear canal and external ear normal.     Left Ear: Tympanic membrane, ear canal and external ear normal.     Nose: Nose normal. No congestion.     Mouth/Throat:     Mouth: Mucous membranes are moist.     Pharynx: Oropharynx is clear. No posterior oropharyngeal erythema.  Eyes:     General: No scleral icterus.       Right eye: No discharge.        Left eye: No discharge.     Conjunctiva/sclera: Conjunctivae normal.     Pupils: Pupils are equal, round, and reactive to light.  Neck:     Thyroid: No thyromegaly.     Vascular: No carotid bruit or JVD.  Cardiovascular:     Rate and Rhythm: Normal rate and regular rhythm.     Pulses: Normal pulses.     Heart sounds: Normal heart sounds.    No gallop.  Pulmonary:     Effort: Pulmonary effort is normal. No respiratory distress.     Breath sounds: Normal breath sounds. No  wheezing or rales.     Comments: Good air exch Chest:     Chest wall: No tenderness.  Abdominal:     General: Bowel sounds are normal. There is no distension or abdominal bruit.     Palpations: Abdomen is soft. There is no mass.     Tenderness: There is no abdominal tenderness.     Hernia: No hernia is present.  Musculoskeletal:        General: No tenderness.     Cervical back: Normal range of motion and neck supple. No rigidity. No muscular tenderness.     Right lower leg: No edema.     Left lower leg: No edema.  Lymphadenopathy:     Cervical: No cervical adenopathy.  Skin:    General: Skin is warm and dry.     Coloration: Skin is not pale.     Findings: No erythema or rash.     Comments: Some lentigines   Few pale SK on arms and hands  Neurological:     Mental Status: He is alert.     Cranial Nerves: No cranial nerve deficit.     Motor: No abnormal muscle tone.     Coordination: Coordination normal.     Gait: Gait normal.     Deep Tendon Reflexes: Reflexes are normal and symmetric. Reflexes normal.  Psychiatric:  Mood and Affect: Mood normal.        Cognition and Memory: Cognition and memory normal.          Assessment & Plan:   Problem List Items Addressed This Visit       Cardiovascular and Mediastinum   Enlargement of abdominal aorta (HCC)    No clinical changes  Working to control bp and cholesterol      Hypertension    bp in fair control at this time  BP Readings from Last 1 Encounters:  07/10/21 135/80  No changes needed Most recent labs reviewed  Disc lifstyle change with low sodium diet and exercise  Plan to continue hctz 25 mg daily  Amlodipine 5 mg daily        Digestive   Fatty liver    AST and ALT are up  Disc ramifications of this  Enc strongly to quit or cut back etoh Also wt loss /low fat diet         Other   Colon cancer screening    Colonoscopy 12/20 with a 5 y recall      Routine general medical examination at a  health care facility - Primary    Reviewed health habits including diet and exercise and skin cancer prevention Reviewed appropriate screening tests for age  Also reviewed health mt list, fam hx and immunization status , as well as social and family history   See HPI Labs reviewed  Interested in shingrix if covered  pna 23 due next  Plans to get flu shot at work  Colonoscopy utd psa is stable  Enc strongly to quit or cut back etoh      Prostate cancer screening    Lab Results  Component Value Date   PSA 0.71 07/03/2021   PSA 0.67 04/03/2020   PSA 0.71 10/25/2018   No clinical changes occ nocturia times one dep on fluid intake        Elevated glucose level    Lab Results  Component Value Date   HGBA1C 5.9 07/03/2021  Up from 5.4 disc imp of low glycemic diet and wt loss to prevent DM2       Morbid obesity ([place])    Reviewed health habits including diet and exercise and skin cancer prevention Reviewed appropriate screening tests for age  Also reviewed health mt list, fam hx and immunization status , as well as social and family history   Offered referral to the healthy weight and wellness clinic when he is ready      Hyperlipidemia    Disc goals for lipids and reasons to control them Rev last labs with pt Rev low sat fat diet in detail LDL of 107, fairly stable  Holding off on statin due to liver enzyme elevation

## 2021-07-12 NOTE — Assessment & Plan Note (Signed)
Lab Results  Component Value Date   PSA 0.71 07/03/2021   PSA 0.67 04/03/2020   PSA 0.71 10/25/2018    No clinical changes occ nocturia times one dep on fluid intake

## 2021-07-12 NOTE — Assessment & Plan Note (Signed)
AST and ALT are up  Disc ramifications of this  Enc strongly to quit or cut back etoh Also wt loss /low fat diet

## 2021-07-12 NOTE — Assessment & Plan Note (Signed)
Colonoscopy 12/20 with a 5 y recall

## 2021-07-12 NOTE — Assessment & Plan Note (Signed)
bp in fair control at this time  BP Readings from Last 1 Encounters:  07/10/21 135/80   No changes needed Most recent labs reviewed  Disc lifstyle change with low sodium diet and exercise  Plan to continue hctz 25 mg daily  Amlodipine 5 mg daily

## 2021-07-12 NOTE — Assessment & Plan Note (Signed)
Reviewed health habits including diet and exercise and skin cancer prevention Reviewed appropriate screening tests for age  Also reviewed health mt list, fam hx and immunization status , as well as social and family history   Offered referral to the healthy weight and wellness clinic when he is ready

## 2021-07-12 NOTE — Assessment & Plan Note (Signed)
Disc goals for lipids and reasons to control them Rev last labs with pt Rev low sat fat diet in detail LDL of 107, fairly stable  Holding off on statin due to liver enzyme elevation

## 2021-07-12 NOTE — Assessment & Plan Note (Addendum)
Reviewed health habits including diet and exercise and skin cancer prevention Reviewed appropriate screening tests for age  Also reviewed health mt list, fam hx and immunization status , as well as social and family history   See HPI Labs reviewed  Interested in shingrix if covered  pna 23 due next  Plans to get flu shot at work  Colonoscopy utd psa is stable  Enc strongly to quit or cut back etoh

## 2021-07-12 NOTE — Assessment & Plan Note (Signed)
No clinical changes  Working to control bp and cholesterol

## 2021-07-12 NOTE — Assessment & Plan Note (Signed)
Lab Results  Component Value Date   HGBA1C 5.9 07/03/2021   Up from 5.4 disc imp of low glycemic diet and wt loss to prevent DM2

## 2021-09-08 ENCOUNTER — Other Ambulatory Visit: Payer: Self-pay | Admitting: Family Medicine

## 2021-10-08 ENCOUNTER — Telehealth: Payer: Self-pay | Admitting: Family Medicine

## 2021-10-08 DIAGNOSIS — E7800 Pure hypercholesterolemia, unspecified: Secondary | ICD-10-CM

## 2021-10-08 DIAGNOSIS — K760 Fatty (change of) liver, not elsewhere classified: Secondary | ICD-10-CM

## 2021-10-08 DIAGNOSIS — I10 Essential (primary) hypertension: Secondary | ICD-10-CM

## 2021-10-08 DIAGNOSIS — R7309 Other abnormal glucose: Secondary | ICD-10-CM

## 2021-10-08 NOTE — Telephone Encounter (Signed)
-----   Message from Ellamae Sia sent at 09/22/2021  3:44 PM EST ----- Regarding: Lab orders for Friday, 12.2.22 Lab orders, thanks

## 2021-10-09 ENCOUNTER — Other Ambulatory Visit: Payer: Commercial Managed Care - PPO

## 2021-10-16 ENCOUNTER — Other Ambulatory Visit (INDEPENDENT_AMBULATORY_CARE_PROVIDER_SITE_OTHER): Payer: Commercial Managed Care - PPO

## 2021-10-16 ENCOUNTER — Other Ambulatory Visit: Payer: Self-pay

## 2021-10-16 DIAGNOSIS — E7800 Pure hypercholesterolemia, unspecified: Secondary | ICD-10-CM | POA: Diagnosis not present

## 2021-10-16 DIAGNOSIS — K760 Fatty (change of) liver, not elsewhere classified: Secondary | ICD-10-CM | POA: Diagnosis not present

## 2021-10-16 DIAGNOSIS — R7309 Other abnormal glucose: Secondary | ICD-10-CM

## 2021-10-16 DIAGNOSIS — I10 Essential (primary) hypertension: Secondary | ICD-10-CM

## 2021-10-16 LAB — HEPATIC FUNCTION PANEL
ALT: 62 U/L — ABNORMAL HIGH (ref 0–53)
AST: 33 U/L (ref 0–37)
Albumin: 4.2 g/dL (ref 3.5–5.2)
Alkaline Phosphatase: 45 U/L (ref 39–117)
Bilirubin, Direct: 0.1 mg/dL (ref 0.0–0.3)
Total Bilirubin: 0.4 mg/dL (ref 0.2–1.2)
Total Protein: 6.8 g/dL (ref 6.0–8.3)

## 2021-10-16 LAB — LIPID PANEL
Cholesterol: 139 mg/dL (ref 0–200)
HDL: 31.7 mg/dL — ABNORMAL LOW (ref >39.00)
LDL Cholesterol: 92 mg/dL (ref 0–99)
NonHDL: 106.9
Total CHOL/HDL Ratio: 4
Triglycerides: 77 mg/dL (ref 0.0–149.0)
VLDL: 15.4 mg/dL (ref 0.0–40.0)

## 2021-10-16 LAB — HEMOGLOBIN A1C: Hgb A1c MFr Bld: 5.9 % (ref 4.6–6.5)

## 2022-07-26 ENCOUNTER — Other Ambulatory Visit: Payer: Self-pay | Admitting: Family Medicine

## 2022-07-26 NOTE — Telephone Encounter (Signed)
Pt hasn't been seen in over a year. Please schedule CPE (labs prior) and then send back to me to refill med. thanks

## 2022-07-26 NOTE — Telephone Encounter (Signed)
Patient scheduled.

## 2022-07-29 ENCOUNTER — Telehealth: Payer: Self-pay | Admitting: Family Medicine

## 2022-07-29 DIAGNOSIS — E7800 Pure hypercholesterolemia, unspecified: Secondary | ICD-10-CM

## 2022-07-29 DIAGNOSIS — R7309 Other abnormal glucose: Secondary | ICD-10-CM

## 2022-07-29 DIAGNOSIS — Z125 Encounter for screening for malignant neoplasm of prostate: Secondary | ICD-10-CM

## 2022-07-29 DIAGNOSIS — I10 Essential (primary) hypertension: Secondary | ICD-10-CM

## 2022-07-29 NOTE — Telephone Encounter (Signed)
-----   Message from Ellamae Sia sent at 07/27/2022  9:47 AM EDT ----- Regarding: Lab orders for Friday, 9.22.23 Patient is scheduled for CPX labs, please order future labs, Thanks , Karna Christmas

## 2022-07-30 ENCOUNTER — Other Ambulatory Visit (INDEPENDENT_AMBULATORY_CARE_PROVIDER_SITE_OTHER): Payer: Medicare Other

## 2022-07-30 DIAGNOSIS — Z125 Encounter for screening for malignant neoplasm of prostate: Secondary | ICD-10-CM | POA: Diagnosis not present

## 2022-07-30 DIAGNOSIS — I10 Essential (primary) hypertension: Secondary | ICD-10-CM

## 2022-07-30 DIAGNOSIS — E7800 Pure hypercholesterolemia, unspecified: Secondary | ICD-10-CM | POA: Diagnosis not present

## 2022-07-30 DIAGNOSIS — R7309 Other abnormal glucose: Secondary | ICD-10-CM

## 2022-07-30 LAB — COMPREHENSIVE METABOLIC PANEL
ALT: 78 U/L — ABNORMAL HIGH (ref 0–53)
AST: 40 U/L — ABNORMAL HIGH (ref 0–37)
Albumin: 4.1 g/dL (ref 3.5–5.2)
Alkaline Phosphatase: 43 U/L (ref 39–117)
BUN: 20 mg/dL (ref 6–23)
CO2: 31 mEq/L (ref 19–32)
Calcium: 10 mg/dL (ref 8.4–10.5)
Chloride: 98 mEq/L (ref 96–112)
Creatinine, Ser: 0.85 mg/dL (ref 0.40–1.50)
GFR: 89.84 mL/min (ref >60.00)
Glucose, Bld: 126 mg/dL — ABNORMAL HIGH (ref 70–99)
Potassium: 4.1 mEq/L (ref 3.5–5.1)
Sodium: 139 mEq/L (ref 135–145)
Total Bilirubin: 0.6 mg/dL (ref 0.2–1.2)
Total Protein: 6.8 g/dL (ref 6.0–8.3)

## 2022-07-30 LAB — LIPID PANEL
Cholesterol: 166 mg/dL (ref 0–200)
HDL: 31.7 mg/dL — ABNORMAL LOW (ref >39.00)
LDL Cholesterol: 111 mg/dL — ABNORMAL HIGH (ref 0–99)
NonHDL: 134.41
Total CHOL/HDL Ratio: 5
Triglycerides: 117 mg/dL (ref 0.0–149.0)
VLDL: 23.4 mg/dL (ref 0.0–40.0)

## 2022-07-30 LAB — CBC WITH DIFFERENTIAL/PLATELET
Basophils Absolute: 0 10*3/uL (ref 0.0–0.1)
Basophils Relative: 0.6 % (ref 0.0–3.0)
Eosinophils Absolute: 0.3 10*3/uL (ref 0.0–0.7)
Eosinophils Relative: 4.6 % (ref 0.0–5.0)
HCT: 42.7 % (ref 39.0–52.0)
Hemoglobin: 14.8 g/dL (ref 13.0–17.0)
Lymphocytes Relative: 35.5 % (ref 12.0–46.0)
Lymphs Abs: 2 10*3/uL (ref 0.7–4.0)
MCHC: 34.6 g/dL (ref 30.0–36.0)
MCV: 91 fl (ref 78.0–100.0)
Monocytes Absolute: 0.5 10*3/uL (ref 0.1–1.0)
Monocytes Relative: 8.2 % (ref 3.0–12.0)
Neutro Abs: 2.9 10*3/uL (ref 1.4–7.7)
Neutrophils Relative %: 51.1 % (ref 43.0–77.0)
Platelets: 202 10*3/uL (ref 150.0–400.0)
RBC: 4.69 Mil/uL (ref 4.22–5.81)
RDW: 13.1 % (ref 11.5–15.5)
WBC: 5.7 10*3/uL (ref 4.0–10.5)

## 2022-07-30 LAB — TSH: TSH: 3.16 u[IU]/mL (ref 0.35–5.50)

## 2022-07-30 LAB — PSA, MEDICARE: PSA: 0.46 ng/ml (ref 0.10–4.00)

## 2022-07-31 LAB — HEMOGLOBIN A1C
Hgb A1c MFr Bld: 5.7 % of total Hgb — ABNORMAL HIGH (ref <5.7)
Mean Plasma Glucose: 117 mg/dL
eAG (mmol/L): 6.5 mmol/L

## 2022-08-06 ENCOUNTER — Encounter: Payer: Self-pay | Admitting: Family Medicine

## 2022-08-06 ENCOUNTER — Ambulatory Visit (INDEPENDENT_AMBULATORY_CARE_PROVIDER_SITE_OTHER): Payer: Medicare Other | Admitting: Family Medicine

## 2022-08-06 VITALS — BP 135/60 | HR 65 | Temp 98.0°F | Ht 68.5 in | Wt 276.2 lb

## 2022-08-06 DIAGNOSIS — K760 Fatty (change of) liver, not elsewhere classified: Secondary | ICD-10-CM | POA: Diagnosis not present

## 2022-08-06 DIAGNOSIS — I10 Essential (primary) hypertension: Secondary | ICD-10-CM | POA: Diagnosis not present

## 2022-08-06 DIAGNOSIS — R7309 Other abnormal glucose: Secondary | ICD-10-CM

## 2022-08-06 DIAGNOSIS — E7800 Pure hypercholesterolemia, unspecified: Secondary | ICD-10-CM

## 2022-08-06 DIAGNOSIS — R7989 Other specified abnormal findings of blood chemistry: Secondary | ICD-10-CM | POA: Insufficient documentation

## 2022-08-06 DIAGNOSIS — Z0000 Encounter for general adult medical examination without abnormal findings: Secondary | ICD-10-CM

## 2022-08-06 DIAGNOSIS — Z125 Encounter for screening for malignant neoplasm of prostate: Secondary | ICD-10-CM

## 2022-08-06 DIAGNOSIS — I7789 Other specified disorders of arteries and arterioles: Secondary | ICD-10-CM

## 2022-08-06 DIAGNOSIS — Z1211 Encounter for screening for malignant neoplasm of colon: Secondary | ICD-10-CM | POA: Diagnosis not present

## 2022-08-06 NOTE — Patient Instructions (Addendum)
Get started with exercise regularly (now that you can walk outside)   Try to get most of your carbohydrates from produce (with the exception of white potatoes)  Eat less bread/pasta/rice/snack foods/cereals/sweets and other items from the middle of the grocery store (processed carbs)  For cholesterol Avoid red meat/ fried foods/ egg yolks/ fatty breakfast meats/ butter, cheese and high fat dairy/ and shellfish    Get your flu shot at the pharmacy   Let us know if you would be interested in a cardiac calcium score test or a visit with a cardiologist to discuss vascular risks   Continue to avoid alcohol   Let's re check your liver tests and cholesterol  after jan 1st  We may want to do another ultrasound of your liver and aorta at that time

## 2022-08-06 NOTE — Progress Notes (Unsigned)
Subjective:    Patient ID: Eric Spears, male    DOB: 11-05-1956, 66 y.o.   MRN: [id number]  HPI Here for health maintenance exam and to review chronic medical problems    Wt Readings from Last 3 Encounters:  08/06/22 276 lb 4 oz (125.3 kg)  07/10/21 274 lb 3 oz (124.4 kg)  11/19/20 270 lb (122.5 kg)   41.39 kg/m  Retired in march but did not want to that early (had to financially)   Has slept a lot  Ate too much -out of boredom  Last month or two - has cut back on some chinese food and cut back on alcohol   No alcohol at all in a month! -proud of this   Now that the weather is cooled off he wants to walk more  Wants to walk 30 minutes twice daily    Immunization History  Administered Date(s) Administered   Influenza,inj,Quad PF,6+ Mos 11/13/2014   Influenza-Unspecified 07/23/2016, 07/23/2017, 07/23/2018   PFIZER(Purple Top)SARS-COV-2 Vaccination 02/07/2020, 02/29/2020   Pneumococcal Conjugate-13 04/18/2020   Pneumococcal Polysaccharide-23 11/14/2021   Tdap 11/13/2014   Zoster Recombinat (Shingrix) 09/13/2021, 11/14/2021   Health Maintenance Due  Topic Date Due   INFLUENZA VACCINE  06/08/2022    Colonoscopy 10/2019 with a 5 yr recall    Prostate health  Lab Results  Component Value Date   PSA 0.46 07/30/2022   PSA 0.71 07/03/2021   PSA 0.67 04/03/2020   No urinary changes  Nocturia once if any    HTN with h/o enlargement of proximal aorta in the past BP Readings from Last 3 Encounters:  08/06/22 (!) 140/60  07/10/21 135/80  04/18/20 138/72   Pulse Readings from Last 3 Encounters:  08/06/22 65  07/10/21 60  04/18/20 (!) 57    Hctz 25 mg daily  Amlodipine 5 mg daily   Lab Results  Component Value Date   CREATININE 0.85 07/30/2022   BUN 20 07/30/2022   NA 139 07/30/2022   K 4.1 07/30/2022   CL 98 07/30/2022   CO2 31 07/30/2022   GFR 89.8    Fatty liver  Lab Results  Component Value Date   ALT 78 (H) 07/30/2022   AST 40 (H)  07/30/2022   ALKPHOS 43 07/30/2022   BILITOT 0.6 07/30/2022    Ast up from 33 Alt up from 62   He quit drinking about 2-3 wk prior to that   Does not take tylenol    Last abd [place] was 2019    Glucose Lab Results  Component Value Date   HGBA1C 5.7 (H) 07/30/2022   This is down from 5.9    Hyperlipidemia Lab Results  Component Value Date   CHOL 166 07/30/2022   CHOL 139 10/16/2021   CHOL 162 07/03/2021   Lab Results  Component Value Date   HDL 31.70 (L) 07/30/2022   HDL 31.70 (L) 10/16/2021   HDL 34.50 (L) 07/03/2021   Lab Results  Component Value Date   LDLCALC 111 (H) 07/30/2022   LDLCALC 92 10/16/2021   LDLCALC 107 (H) 07/03/2021   Lab Results  Component Value Date   TRIG 117.0 07/30/2022   TRIG 77.0 10/16/2021   TRIG 104.0 07/03/2021   Lab Results  Component Value Date   CHOLHDL 5 07/30/2022   CHOLHDL 4 10/16/2021   CHOLHDL 5 07/03/2021   No results found for: "LDLDIRECT" Not on statin due to LFT elevation   Thinks he will need to stop eating  out  Lots of [place] food    Other labs Lab Results  Component Value Date   WBC 5.7 07/30/2022   HGB 14.8 07/30/2022   HCT 42.7 07/30/2022   MCV 91.0 07/30/2022   PLT 202.0 07/30/2022   Lab Results  Component Value Date   WBC 5.7 07/30/2022   HGB 14.8 07/30/2022   HCT 42.7 07/30/2022   MCV 91.0 07/30/2022   PLT 202.0 07/30/2022     Review of Systems     Objective:   Physical Exam        Assessment & Plan:

## 2022-08-08 NOTE — Assessment & Plan Note (Signed)
ast and alt did go up slightly  No symptoms  Has not had alcohol for 3 wk prior (commended)  Suggested re check of ultrasound- pt declines this for now but open to re checking labs in 6 months  Enc him to continue to avoid etoh Does not take tylenol  Discussed strategy for weight loss

## 2022-08-08 NOTE — Assessment & Plan Note (Signed)
LDL is 111 Ratio of 5 with low HDL  Disc need for more exercise  Avoiding statin due to elevated LFT  May be candidate for other med like zetia  First he would like to work on diet (also wt loss)  Disc vascular risks  Will re check this in 6 mo with LFTs

## 2022-08-08 NOTE — Assessment & Plan Note (Signed)
Lab Results  Component Value Date   HGBA1C 5.7 (H) 07/30/2022   This is down from 5.9  Starting to pay attn to diet - commended  Planning to work on wt loss disc imp of low glycemic diet and wt loss to prevent DM2

## 2022-08-08 NOTE — Assessment & Plan Note (Deleted)
Reviewed health habits including diet and exercise and skin cancer prevention Reviewed appropriate screening tests for age  Also reviewed health mt list, fam hx and immunization status , as well as social and family history   See HPI Labs reviewed  Declines flu shot today Colonoscopy utd with 5 y recall  Nl psa/ no urinary changes  Commended on stopping ETOH Disc strategies for fitness and wt loss

## 2022-08-08 NOTE — Assessment & Plan Note (Signed)
Optimally re check this with [place]  Pt is not ready, discussed

## 2022-08-08 NOTE — Assessment & Plan Note (Signed)
Lab Results  Component Value Date   PSA 0.46 07/30/2022   PSA 0.71 07/03/2021   PSA 0.67 04/03/2020    No urinary changes or nocturia

## 2022-08-08 NOTE — Assessment & Plan Note (Signed)
Discussed how this problem influences overall health and the risks it imposes  Reviewed plan for weight loss with lower calorie diet (via better food choices and also portion control or program like weight watchers) and exercise building up to or more than 30 minutes 5 days per week including some aerobic activity   Pt is ready to start working on this

## 2022-08-08 NOTE — Assessment & Plan Note (Signed)
Due to fatty liver and past etoh intake No alcohol for about 1 mo-commended Re check 6 mo (pt declines earlier check)

## 2022-08-08 NOTE — Assessment & Plan Note (Signed)
bp in fair control at this time  BP Readings from Last 1 Encounters:  08/06/22 135/60   No changes needed Most recent labs reviewed  Disc lifstyle change with low sodium diet and exercise  Plan to continue  hctz 25 mg daily  Amlodipine 5 mg daily

## 2022-08-08 NOTE — Assessment & Plan Note (Signed)
Colonoscopy utd 10/2019

## 2022-11-08 ENCOUNTER — Telehealth: Payer: Self-pay | Admitting: Family Medicine

## 2022-11-08 DIAGNOSIS — E7800 Pure hypercholesterolemia, unspecified: Secondary | ICD-10-CM

## 2022-11-08 DIAGNOSIS — R7989 Other specified abnormal findings of blood chemistry: Secondary | ICD-10-CM

## 2022-11-08 NOTE — Telephone Encounter (Signed)
-----   Message from Velna Hatchet, RT sent at 10/25/2022 10:33 AM EST ----- Regarding: Tue 1/2 lab Lab orders needed for labs only appt on 11/09/22, please.  Thanks, Anda Kraft

## 2022-11-09 ENCOUNTER — Other Ambulatory Visit (INDEPENDENT_AMBULATORY_CARE_PROVIDER_SITE_OTHER): Payer: Medicare Other

## 2022-11-09 DIAGNOSIS — E7800 Pure hypercholesterolemia, unspecified: Secondary | ICD-10-CM | POA: Diagnosis not present

## 2022-11-09 DIAGNOSIS — R7989 Other specified abnormal findings of blood chemistry: Secondary | ICD-10-CM

## 2022-11-09 LAB — HEPATIC FUNCTION PANEL
ALT: 36 U/L (ref 0–53)
AST: 23 U/L (ref 0–37)
Albumin: 4.2 g/dL (ref 3.5–5.2)
Alkaline Phosphatase: 41 U/L (ref 39–117)
Bilirubin, Direct: 0.1 mg/dL (ref 0.0–0.3)
Total Bilirubin: 0.5 mg/dL (ref 0.2–1.2)
Total Protein: 6.5 g/dL (ref 6.0–8.3)

## 2022-11-09 LAB — LIPID PANEL
Cholesterol: 154 mg/dL (ref 0–200)
HDL: 34.5 mg/dL — ABNORMAL LOW (ref >39.00)
LDL Cholesterol: 97 mg/dL (ref 0–99)
NonHDL: 119.52
Total CHOL/HDL Ratio: 4
Triglycerides: 112 mg/dL (ref 0.0–149.0)
VLDL: 22.4 mg/dL (ref 0.0–40.0)

## 2022-11-10 ENCOUNTER — Encounter: Payer: Self-pay | Admitting: *Deleted

## 2022-11-11 ENCOUNTER — Other Ambulatory Visit: Payer: Self-pay | Admitting: Family Medicine

## 2022-11-13 ENCOUNTER — Encounter: Payer: Self-pay | Admitting: Family Medicine

## 2023-05-02 ENCOUNTER — Encounter: Payer: Self-pay | Admitting: Family Medicine

## 2023-09-11 ENCOUNTER — Other Ambulatory Visit: Payer: Self-pay | Admitting: Family Medicine

## 2023-10-09 ENCOUNTER — Telehealth: Payer: Self-pay | Admitting: Family Medicine

## 2023-10-09 DIAGNOSIS — Z125 Encounter for screening for malignant neoplasm of prostate: Secondary | ICD-10-CM

## 2023-10-09 DIAGNOSIS — E7800 Pure hypercholesterolemia, unspecified: Secondary | ICD-10-CM

## 2023-10-09 DIAGNOSIS — K760 Fatty (change of) liver, not elsewhere classified: Secondary | ICD-10-CM

## 2023-10-09 DIAGNOSIS — R7309 Other abnormal glucose: Secondary | ICD-10-CM

## 2023-10-09 DIAGNOSIS — I10 Essential (primary) hypertension: Secondary | ICD-10-CM

## 2023-10-09 NOTE — Telephone Encounter (Signed)
-----   Message from Lovena Neighbours sent at 09/23/2023  2:01 PM EST ----- Regarding: Labs for Tuesday 12.3.24 Please put fasting lab orders in future. Thank you, Denny Peon

## 2023-10-11 ENCOUNTER — Other Ambulatory Visit (INDEPENDENT_AMBULATORY_CARE_PROVIDER_SITE_OTHER): Payer: Medicare Other

## 2023-10-11 ENCOUNTER — Ambulatory Visit (INDEPENDENT_AMBULATORY_CARE_PROVIDER_SITE_OTHER): Payer: Medicare Other

## 2023-10-11 VITALS — BP 128/80 | Ht 69.0 in | Wt 250.0 lb

## 2023-10-11 DIAGNOSIS — Z23 Encounter for immunization: Secondary | ICD-10-CM | POA: Diagnosis not present

## 2023-10-11 DIAGNOSIS — K760 Fatty (change of) liver, not elsewhere classified: Secondary | ICD-10-CM | POA: Diagnosis not present

## 2023-10-11 DIAGNOSIS — Z0000 Encounter for general adult medical examination without abnormal findings: Secondary | ICD-10-CM | POA: Diagnosis not present

## 2023-10-11 DIAGNOSIS — Z125 Encounter for screening for malignant neoplasm of prostate: Secondary | ICD-10-CM | POA: Diagnosis not present

## 2023-10-11 DIAGNOSIS — I10 Essential (primary) hypertension: Secondary | ICD-10-CM | POA: Diagnosis not present

## 2023-10-11 DIAGNOSIS — R7309 Other abnormal glucose: Secondary | ICD-10-CM

## 2023-10-11 LAB — CBC WITH DIFFERENTIAL/PLATELET
Basophils Absolute: 0 10*3/uL (ref 0.0–0.1)
Basophils Relative: 0.8 % (ref 0.0–3.0)
Eosinophils Absolute: 0.2 10*3/uL (ref 0.0–0.7)
Eosinophils Relative: 2.6 % (ref 0.0–5.0)
HCT: 42.2 % (ref 39.0–52.0)
Hemoglobin: 14.4 g/dL (ref 13.0–17.0)
Lymphocytes Relative: 37.4 % (ref 12.0–46.0)
Lymphs Abs: 2.4 10*3/uL (ref 0.7–4.0)
MCHC: 34.1 g/dL (ref 30.0–36.0)
MCV: 89.4 fL (ref 78.0–100.0)
Monocytes Absolute: 0.4 10*3/uL (ref 0.1–1.0)
Monocytes Relative: 6.8 % (ref 3.0–12.0)
Neutro Abs: 3.3 10*3/uL (ref 1.4–7.7)
Neutrophils Relative %: 52.4 % (ref 43.0–77.0)
Platelets: 214 10*3/uL (ref 150.0–400.0)
RBC: 4.72 Mil/uL (ref 4.22–5.81)
RDW: 12.9 % (ref 11.5–15.5)
WBC: 6.3 10*3/uL (ref 4.0–10.5)

## 2023-10-11 LAB — COMPREHENSIVE METABOLIC PANEL
ALT: 18 U/L (ref 0–53)
AST: 16 U/L (ref 0–37)
Albumin: 4.2 g/dL (ref 3.5–5.2)
Alkaline Phosphatase: 38 U/L — ABNORMAL LOW (ref 39–117)
BUN: 20 mg/dL (ref 6–23)
CO2: 33 meq/L — ABNORMAL HIGH (ref 19–32)
Calcium: 9.7 mg/dL (ref 8.4–10.5)
Chloride: 105 meq/L (ref 96–112)
Creatinine, Ser: 0.8 mg/dL (ref 0.40–1.50)
GFR: 90.74 mL/min (ref >60.00)
Glucose, Bld: 100 mg/dL — ABNORMAL HIGH (ref 70–99)
Potassium: 4.7 meq/L (ref 3.5–5.1)
Sodium: 143 meq/L (ref 135–145)
Total Bilirubin: 0.5 mg/dL (ref 0.2–1.2)
Total Protein: 6.5 g/dL (ref 6.0–8.3)

## 2023-10-11 LAB — LIPID PANEL
Cholesterol: 143 mg/dL (ref 0–200)
HDL: 30.5 mg/dL — ABNORMAL LOW (ref >39.00)
LDL Cholesterol: 90 mg/dL (ref 0–99)
NonHDL: 112.15
Total CHOL/HDL Ratio: 5
Triglycerides: 110 mg/dL (ref 0.0–149.0)
VLDL: 22 mg/dL (ref 0.0–40.0)

## 2023-10-11 LAB — TSH: TSH: 2.54 u[IU]/mL (ref 0.35–5.50)

## 2023-10-11 LAB — PSA, MEDICARE: PSA: 0.83 ng/mL (ref 0.10–4.00)

## 2023-10-11 LAB — HEMOGLOBIN A1C: Hgb A1c MFr Bld: 5.3 % (ref 4.6–6.5)

## 2023-10-11 NOTE — Progress Notes (Signed)
Subjective:   Eric Spears is a 67 y.o. male who presents for an Initial Medicare Annual Wellness Visit.  Visit Complete: In person  Patient Medicare AWV questionnaire was completed by the patient on 10/04/23; I have confirmed that all information answered by patient is correct and no changes since this date.  Cardiac Risk Factors include: advanced age (>13men, >73 women);hypertension;male gender;dyslipidemia;obesity (BMI >30kg/m2)    Objective:    Today's Vitals   10/11/23 0814  BP: 128/80  Weight: 250 lb (113.4 kg)  Height: 5\' 9"  (1.753 m)   Body mass index is 36.92 kg/m.     10/11/2023    8:28 AM 09/05/2014    7:21 AM 08/23/2014    4:07 PM  Advanced Directives  Does Patient Have a Medical Advance Directive? No No No  Would patient like information on creating a medical advance directive?  No - patient declined information     Current Medications (verified) Outpatient Encounter Medications as of 10/11/2023  Medication Sig   amLODipine (NORVASC) 5 MG tablet Take 1 tablet by mouth once daily   aspirin 325 MG EC tablet Take 325 mg by mouth 2 (two) times daily.    cetirizine (ZYRTEC) 10 MG tablet Take 10 mg by mouth daily as needed.     hydrochlorothiazide (HYDRODIURIL) 25 MG tablet Take 1 tablet by mouth once daily   Multiple Vitamin (MULTIVITAMIN) tablet Take 1 tablet by mouth daily.     OVER THE COUNTER MEDICATION B complex vitamin. One tablet daily.   OVER THE COUNTER MEDICATION Fish Oil, one capsule daily.   OVER THE COUNTER MEDICATION Flaxseed, one tablet daily.   No facility-administered encounter medications on file as of 10/11/2023.    Allergies (verified) Bee venom   History: Past Medical History:  Diagnosis Date   Allergy    BPH (benign prostatic hyperplasia)    Depression    ED (erectile dysfunction)    Elevated liver function tests    GERD (gastroesophageal reflux disease)    Hypertension    Low HDL (under 40) 10/05   Past Surgical History:   Procedure Laterality Date   Finger trauma     NASAL SEPTUM SURGERY     Nose   Stress cardiolite  1998   Normal   Family History  Problem Relation Age of Onset   Heart disease Father        CVA   Cancer Other        were smokers   Cancer Mother        uterine   Heart disease Mother        valve dysfunction in heart   Diabetes Maternal Grandmother    Obesity Other    Colon polyps Cousin        first cousins maternal and paternal   Colon cancer Other 35       mothers first cousin   Rectal cancer Neg Hx    Stomach cancer Neg Hx    Social History   Socioeconomic History   Marital status: Single    Spouse name: Not on file   Number of children: 0   Years of education: Not on file   Highest education level: Not on file  Occupational History   Occupation: [employer]  Tobacco Use   Smoking status: Never   Smokeless tobacco: Never  Substance and Sexual Activity   Alcohol use: Yes    Alcohol/week: 2.0 standard drinks of alcohol    Types: 2 Standard drinks  or equivalent per week    Comment: occ   Drug use: No   Sexual activity: Not on file  Other Topics Concern   Not on file  Social History Narrative   Walks once per week for exercise.   Social Determinants of Health   Financial Resource Strain: Low Risk  (10/04/2023)   Overall Financial Resource Strain (CARDIA)    Difficulty of Paying Living Expenses: Not hard at all  Food Insecurity: No Food Insecurity (10/04/2023)   Hunger Vital Sign    Worried About Running Out of Food in the Last Year: Never true    Ran Out of Food in the Last Year: Never true  Transportation Needs: No Transportation Needs (10/04/2023)   PRAPARE - Administrator, Civil Service (Medical): No    Lack of Transportation (Non-Medical): No  Physical Activity: Insufficiently Active (10/04/2023)   Exercise Vital Sign    Days of Exercise per Week: 3 days    Minutes of Exercise per Session: 30 min  Stress: No Stress Concern Present  (10/04/2023)   [employer] of Occupational Health - Occupational Stress Questionnaire    Feeling of Stress : Not at all  Social Connections: Unknown (10/04/2023)   Social Connection and Isolation Panel [NHANES]    Frequency of Communication with Friends and Family: Once a week    Frequency of Social Gatherings with Friends and Family: Once a week    Attends Religious Services: Not on Marketing executive or Organizations: No    Attends Banker Meetings: Never    Marital Status: Never married    Tobacco Counseling Counseling given: Not Answered  Clinical Intake:  Pre-visit preparation completed: Yes  Pain : No/denies pain   BMI - recorded: 36.92 Nutritional Status: BMI > 30  Obese Nutritional Risks: None Diabetes: No  How often do you need to have someone help you when you read instructions, pamphlets, or other written materials from your doctor or pharmacy?: 1 - Never  Interpreter Needed?: No  Comments: live alone Information entered by :: B.Huxley Shurley,lpn   Activities of Daily Living    10/04/2023    6:25 PM  In your present state of health, do you have any difficulty performing the following activities:  Hearing? 0  Vision? 1  Difficulty concentrating or making decisions? 0  Walking or climbing stairs? 0  Dressing or bathing? 0  Doing errands, shopping? 0  Preparing Food and eating ? N  Using the Toilet? N  In the past six months, have you accidently leaked urine? N  Do you have problems with loss of bowel control? N  Managing your Medications? N  Managing your Finances? N  Housekeeping or managing your Housekeeping? N    Patient Care Team: [place], Audrie Gallus, MD as PCP - [facility], [facility]  Indicate any recent Medical Services you may have received from other than [facility] providers in the past year (date may be approximate).     Assessment:   This is a routine wellness examination for [place].  Hearing/Vision  screen Hearing Screening - Comments:: Pt says he has ringing in both ears but hears well Vision Screening - Comments:: Pt says his vision is pretty good with glasses [facility]   Goals Addressed             This Visit's Progress    Patient Stated       Stay as healthy as possible and lose  25 lbs by increasing to water to at least 32oz.        Depression Screen    10/11/2023    8:21 AM 08/06/2022    3:35 PM 04/18/2020    9:53 AM 10/31/2018    9:04 AM 10/19/2017    4:07 PM  PHQ 2/9 Scores  PHQ - 2 Score 0 0 0 0 0  PHQ- 9 Score  0       Fall Risk    10/04/2023    6:25 PM 08/06/2022    2:44 PM 07/10/2021   10:58 AM 04/18/2020    9:53 AM  Fall Risk   Falls in the past year? 0 0 0 0  Number falls in past yr: 0   0  Injury with Fall? 0     Risk for fall due to : No Fall Risks     Follow up Falls prevention discussed;Education provided Falls evaluation completed Falls evaluation completed Falls evaluation completed    MEDICARE RISK AT HOME: Medicare Risk at Home Any stairs in or around the home?: Yes If so, are there any without handrails?: Yes Home free of loose throw rugs in walkways, pet beds, electrical cords, etc?: No Adequate lighting in your home to reduce risk of falls?: Yes Life alert?: No Use of a cane, walker or w/c?: No Grab bars in the bathroom?: No Shower chair or bench in shower?: No Elevated toilet seat or a handicapped toilet?: No  TIMED UP AND GO:  Was the test performed? Yes  Length of time to ambulate 10 feet: 10 sec Gait steady and fast without use of assistive device    Cognitive Function:        10/11/2023    8:28 AM  6CIT Screen  What Year? 0 points  What month? 0 points  What time? 0 points  Count back from 20 0 points  Months in reverse 0 points  Repeat phrase 0 points  Total Score 0 points    Immunizations Immunization History  Administered Date(s) Administered   Fluad Trivalent(High Dose 65+) 10/11/2023    Influenza,inj,Quad PF,6+ Mos 11/13/2014   Influenza-Unspecified 07/23/2016, 07/23/2017, 07/23/2018   PFIZER(Purple Top)SARS-COV-2 Vaccination 02/07/2020, 02/29/2020   Pneumococcal Conjugate-13 04/18/2020   Pneumococcal Polysaccharide-23 11/14/2021   Tdap 11/13/2014   Zoster Recombinant(Shingrix) 09/13/2021, 11/14/2021    TDAP status: Up to date  Flu Vaccine status: Completed at today's visit  Pneumococcal vaccine status: Up to date  Covid-19 vaccine status: Completed vaccines  Qualifies for Shingles Vaccine? Yes   Zostavax completed Yes   Shingrix Completed?: Yes  Screening Tests Health Maintenance  Topic Date Due   COVID-19 Vaccine (3 - 2023-24 season) 10/27/2023 (Originally 07/10/2023)   Medicare Annual Wellness (AWV)  10/10/2024   Colonoscopy  10/21/2024   DTaP/Tdap/Td (2 - Td or Tdap) 11/13/2024   Pneumonia Vaccine 36+ Years old  Completed   INFLUENZA VACCINE  Completed   Hepatitis C Screening  Completed   Zoster Vaccines- Shingrix  Completed   HPV VACCINES  Aged Out    Health Maintenance  There are no preventive care reminders to display for this patient.   Colorectal cancer screening: Type of screening: Colonoscopy. Completed 10/22/2019. Repeat every 5 years  Lung Cancer Screening: (Low Dose CT Chest recommended if Age 46-80 years, 20 pack-year currently smoking OR have quit w/in 15years.) does not qualify.   Lung Cancer Screening Referral: no  Additional Screening:  Hepatitis C Screening: does not qualify; Completed 10/13/2016  Vision Screening: Recommended  annual ophthalmology exams for early detection of glaucoma and other disorders of the eye. Is the patient up to date with their annual eye exam?  No  Who is the provider or what is the name of the office in which the patient attends annual eye exams? [facility] If pt is not established with a provider, would they like to be referred to a provider to establish care? No .   Dental Screening: Recommended  annual dental exams for proper oral hygiene  Diabetic Foot Exam: n/a  Community Resource Referral / Chronic Care Management: CRR required this visit?  No   CCM required this visit?  No    Plan:     I have personally reviewed and noted the following in the patient's chart:   Medical and social history Use of alcohol, tobacco or illicit drugs  Current medications and supplements including opioid prescriptions. Patient is not currently taking opioid prescriptions. Functional ability and status Nutritional status Physical activity Advanced directives List of other physicians Hospitalizations, surgeries, and ER visits in previous 12 months Vitals Screenings to include cognitive, depression, and falls Referrals and appointments  In addition, I have reviewed and discussed with patient certain preventive protocols, quality metrics, and best practice recommendations. A written personalized care plan for preventive services as well as general preventive health recommendations were provided to patient.    Sue Lush, LPN   [id number]   After Visit Summary: (MyChart) Due to this being a telephonic visit, the after visit summary with patients personalized plan was offered to patient via MyChart   Nurse Notes: The patient states he is doing well and has no concerns or questions at this time.

## 2023-10-11 NOTE — Patient Instructions (Addendum)
Eric Spears , Thank you for taking time to come for your Medicare Wellness Visit. I appreciate your ongoing commitment to your health goals. Please review the following plan we discussed and let me know if I can assist you in the future.   Referrals/Orders/Follow-Ups/Clinician Recommendations: none  This is a list of the screening recommended for you and due dates:  Health Maintenance  Topic Date Due   COVID-19 Vaccine (3 - 2023-24 season) 10/27/2023*   Medicare Annual Wellness Visit  10/10/2024   Colon Cancer Screening  10/21/2024   DTaP/Tdap/Td vaccine (2 - Td or Tdap) 11/13/2024   Pneumonia Vaccine  Completed   Flu Shot  Completed   Hepatitis C Screening  Completed   Zoster (Shingles) Vaccine  Completed   HPV Vaccine  Aged Out  *Topic was postponed. The date shown is not the original due date.    Advanced directives: (Declined) Advance directive discussed with you today. Even though you declined this today, please call our office should you change your mind, and we can give you the proper paperwork for you to fill out.  Next Medicare Annual Wellness Visit scheduled for next year: Yes 10/11/24 @ 8:10am in person

## 2023-10-18 ENCOUNTER — Ambulatory Visit: Payer: Medicare Other | Admitting: Family Medicine

## 2023-10-18 ENCOUNTER — Encounter: Payer: Self-pay | Admitting: Family Medicine

## 2023-10-18 VITALS — BP 125/70 | HR 63 | Temp 98.1°F | Ht 68.5 in | Wt 250.1 lb

## 2023-10-18 DIAGNOSIS — I10 Essential (primary) hypertension: Secondary | ICD-10-CM

## 2023-10-18 DIAGNOSIS — E7800 Pure hypercholesterolemia, unspecified: Secondary | ICD-10-CM | POA: Diagnosis not present

## 2023-10-18 DIAGNOSIS — Z1211 Encounter for screening for malignant neoplasm of colon: Secondary | ICD-10-CM | POA: Diagnosis not present

## 2023-10-18 DIAGNOSIS — I7789 Other specified disorders of arteries and arterioles: Secondary | ICD-10-CM

## 2023-10-18 DIAGNOSIS — K760 Fatty (change of) liver, not elsewhere classified: Secondary | ICD-10-CM

## 2023-10-18 DIAGNOSIS — Z0000 Encounter for general adult medical examination without abnormal findings: Secondary | ICD-10-CM

## 2023-10-18 DIAGNOSIS — R7309 Other abnormal glucose: Secondary | ICD-10-CM

## 2023-10-18 DIAGNOSIS — Z125 Encounter for screening for malignant neoplasm of prostate: Secondary | ICD-10-CM

## 2023-10-18 DIAGNOSIS — Q254 Congenital malformation of aorta unspecified: Secondary | ICD-10-CM | POA: Insufficient documentation

## 2023-10-18 MED ORDER — AMLODIPINE BESYLATE 5 MG PO TABS: 5.0000 mg | ORAL_TABLET | Freq: Every day | ORAL | 3 refills | Status: DC

## 2023-10-18 MED ORDER — HYDROCHLOROTHIAZIDE 25 MG PO TABS: 25.0000 mg | ORAL_TABLET | Freq: Every day | ORAL | 3 refills | Status: AC

## 2023-10-18 NOTE — Assessment & Plan Note (Signed)
Disc goals for lipids and reasons to control them Rev last labs with pt Rev low sat fat diet in detail  LDL down to 90 with improved diet  HDL remains low (on fish oil and flax seed) Encouraged more exercise

## 2023-10-18 NOTE — Assessment & Plan Note (Signed)
Normal LFTs  24 lb weight loss since last fall- commended Has stopped drinking alcohol- also commended

## 2023-10-18 NOTE — Assessment & Plan Note (Signed)
Pt is agreeable to [place] to monitor now  Order done  No symptoms   Continue to monitor cholesterol

## 2023-10-18 NOTE — Progress Notes (Signed)
Subjective:    Patient ID: Eric Spears, male    DOB: 08-01-1956, 67 y.o.   MRN: [id number]  HPI  Pt presents for annual follow up of chronic health problems   Wt Readings from Last 3 Encounters:  10/18/23 250 lb 2 oz (113.5 kg)  10/11/23 250 lb (113.4 kg)  08/06/22 276 lb 4 oz (125.3 kg)   37.48 kg/m  Vitals:   10/18/23 1402 10/18/23 1435  BP: 136/74 125/70  Pulse: 63   Temp: 98.1 F (36.7 C)   SpO2: 96%     Immunization History  Administered Date(s) Administered   Fluad Trivalent(High Dose 65+) 10/11/2023   Influenza,inj,Quad PF,6+ Mos 11/13/2014   Influenza-Unspecified 07/23/2016, 07/23/2017, 07/23/2018   PFIZER(Purple Top)SARS-COV-2 Vaccination 02/07/2020, 02/29/2020   Pneumococcal Conjugate-13 04/18/2020   Pneumococcal Polysaccharide-23 11/14/2021   Tdap 11/13/2014   Zoster Recombinant(Shingrix) 09/13/2021, 11/14/2021    There are no preventive care reminders to display for this patient.    Prostate health Lab Results  Component Value Date   PSA 0.83 10/11/2023   PSA 0.46 07/30/2022   PSA 0.71 07/03/2021   No changes in urination except flow is slightly slower  No nocturia  No family history of prostate cancer     Colon cancer screening -colonoscopy due for 5 y recall 10/2024     Bone health   Falls- none co Fractures-none  Supplements -vitamin D   Exercise  Walking a mile at least 3 times per week   Eating better  Avoiding alcohol , has lost weight doing this      Mood    10/11/2023    8:21 AM 08/06/2022    3:35 PM 04/18/2020    9:53 AM 10/31/2018    9:04 AM 10/19/2017    4:07 PM  Depression screen PHQ 2/9  Decreased Interest 0 0 0 0 0  Down, Depressed, Hopeless 0 0 0 0 0  PHQ - 2 Score 0 0 0 0 0  Altered sleeping  0     Tired, decreased energy  0     Change in appetite  0     Feeling bad or failure about yourself   0     Trouble concentrating  0     Moving slowly or fidgety/restless  0     Suicidal thoughts  0      PHQ-9 Score  0     Difficult doing work/chores  Not difficult at all      HTN bp is stable today  No cp or palpitations or headaches or edema  No side effects to medicines  BP Readings from Last 3 Encounters:  10/18/23 125/70  10/11/23 128/80  08/06/22 135/60    Hydrochlorothiazide 25 mg daily  Amlodipine  5 mg daily   Lab Results  Component Value Date   NA 143 10/11/2023   K 4.7 10/11/2023   CO2 33 (H) 10/11/2023   GLUCOSE 100 (H) 10/11/2023   BUN 20 10/11/2023   CREATININE 0.80 10/11/2023   CALCIUM 9.7 10/11/2023   GFR 90.74 10/11/2023   GFRNONAA 93.27 07/09/2009     Has history of enlargement of AA in past 3.2 cm     Past history of fatty liver  Lab Results  Component Value Date   ALT 18 10/11/2023   AST 16 10/11/2023   ALKPHOS 38 (L) 10/11/2023   BILITOT 0.5 10/11/2023    Etoh intake  As a rule none  Perhaps one glass of wine in 3  months    Glucose elevated in past  Lab Results  Component Value Date   HGBA1C 5.3 10/11/2023   Below prediabetes range    Hyperlipidemia Lab Results  Component Value Date   CHOL 143 10/11/2023   CHOL 154 11/09/2022   CHOL 166 07/30/2022   Lab Results  Component Value Date   HDL 30.50 (L) 10/11/2023   HDL 34.50 (L) 11/09/2022   HDL 31.70 (L) 07/30/2022   Lab Results  Component Value Date   LDLCALC 90 10/11/2023   LDLCALC 97 11/09/2022   LDLCALC 111 (H) 07/30/2022   Lab Results  Component Value Date   TRIG 110.0 10/11/2023   TRIG 112.0 11/09/2022   TRIG 117.0 07/30/2022   Lab Results  Component Value Date   CHOLHDL 5 10/11/2023   CHOLHDL 4 11/09/2022   CHOLHDL 5 07/30/2022   No results found for: "LDLDIRECT"  Avoiding statin due to past increase in LFTs  Takes fish oil and flex seed       Patient Active Problem List   Diagnosis Date Noted   Hyperlipidemia 11/01/2018   Hypertension 10/03/2018   Morbid obesity (HCC) 10/03/2018   Enlargement of abdominal aorta (HCC) 12/26/2017   Fatty liver  10/19/2017   Elevated glucose level 10/18/2016   Somnolence, daytime 11/14/2014   Routine general medical examination at a health care facility 11/05/2014   Prostate cancer screening 11/05/2014   Colon cancer screening 07/03/2014   Past Medical History:  Diagnosis Date   Allergy    BPH (benign prostatic hyperplasia)    Depression    ED (erectile dysfunction)    Elevated liver function tests    GERD (gastroesophageal reflux disease)    Hypertension    Low HDL (under 40) 10/05   Past Surgical History:  Procedure Laterality Date   Finger trauma     NASAL SEPTUM SURGERY     Nose   Stress cardiolite  1998   Normal   Social History   Tobacco Use   Smoking status: Never   Smokeless tobacco: Never  Substance Use Topics   Alcohol use: Yes    Alcohol/week: 2.0 standard drinks of alcohol    Types: 2 Standard drinks or equivalent per week    Comment: occ   Drug use: No   Family History  Problem Relation Age of Onset   Heart disease Father        CVA   Cancer Other        were smokers   Cancer Mother        uterine   Heart disease Mother        valve dysfunction in heart   Diabetes Maternal Grandmother    Obesity Other    Colon polyps Cousin        first cousins maternal and paternal   Colon cancer Other 11       mothers first cousin   Rectal cancer Neg Hx    Stomach cancer Neg Hx    Allergies  Allergen Reactions   Bee Venom    Current Outpatient Medications on File Prior to Visit  Medication Sig Dispense Refill   aspirin 325 MG EC tablet Take 325 mg by mouth 2 (two) times daily.      cetirizine (ZYRTEC) 10 MG tablet Take 10 mg by mouth daily as needed.       Multiple Vitamin (MULTIVITAMIN) tablet Take 1 tablet by mouth daily.       OVER THE COUNTER MEDICATION B  complex vitamin. One tablet daily.     OVER THE COUNTER MEDICATION Fish Oil, one capsule daily.     OVER THE COUNTER MEDICATION Flaxseed, one tablet daily.     No current facility-administered  medications on file prior to visit.    Review of Systems  Constitutional:  Negative for activity change, appetite change, fatigue, fever and unexpected weight change.  HENT:  Negative for congestion, rhinorrhea, sore throat and trouble swallowing.   Eyes:  Negative for pain, redness, itching and visual disturbance.  Respiratory:  Negative for cough, chest tightness, shortness of breath and wheezing.   Cardiovascular:  Negative for chest pain and palpitations.  Gastrointestinal:  Negative for abdominal pain, blood in stool, constipation, diarrhea and nausea.  Endocrine: Negative for cold intolerance, heat intolerance, polydipsia and polyuria.  Genitourinary:  Negative for difficulty urinating, dysuria, frequency and urgency.  Musculoskeletal:  Negative for arthralgias, joint swelling and myalgias.  Skin:  Negative for pallor and rash.  Neurological:  Negative for dizziness, tremors, weakness, numbness and headaches.  Hematological:  Negative for adenopathy. Does not bruise/bleed easily.  Psychiatric/Behavioral:  Negative for decreased concentration and dysphoric mood. The patient is not nervous/anxious.        Objective:   Physical Exam Constitutional:      General: He is not in acute distress.    Appearance: Normal appearance. He is well-developed. He is obese. He is not ill-appearing or diaphoretic.  HENT:     Head: Normocephalic and atraumatic.     Right Ear: Tympanic membrane, ear canal and external ear normal.     Left Ear: Tympanic membrane, ear canal and external ear normal.     Nose: Nose normal. No congestion.     Mouth/Throat:     Mouth: Mucous membranes are moist.     Pharynx: Oropharynx is clear. No posterior oropharyngeal erythema.  Eyes:     General: No scleral icterus.       Right eye: No discharge.        Left eye: No discharge.     Conjunctiva/sclera: Conjunctivae normal.     Pupils: Pupils are equal, round, and reactive to light.  Neck:     Thyroid: No  thyromegaly.     Vascular: No carotid bruit or JVD.  Cardiovascular:     Rate and Rhythm: Normal rate and regular rhythm.     Pulses: Normal pulses.     Heart sounds: Normal heart sounds.     No gallop.  Pulmonary:     Effort: Pulmonary effort is normal. No respiratory distress.     Breath sounds: Normal breath sounds. No wheezing or rales.     Comments: Good air exch Chest:     Chest wall: No tenderness.  Abdominal:     General: Abdomen is protuberant. Bowel sounds are normal. There is no distension or abdominal bruit.     Palpations: Abdomen is soft. There is no mass or pulsatile mass.     Tenderness: There is no abdominal tenderness.     Hernia: No hernia is present.  Musculoskeletal:        General: No tenderness.     Cervical back: Normal range of motion and neck supple. No rigidity. No muscular tenderness.     Right lower leg: No edema.     Left lower leg: No edema.  Lymphadenopathy:     Cervical: No cervical adenopathy.  Skin:    General: Skin is warm and dry.     Coloration: Skin is  not pale.     Findings: No erythema or rash.     Comments: Solar lentigines diffusely   Neurological:     Mental Status: He is alert.     Cranial Nerves: No cranial nerve deficit.     Motor: No abnormal muscle tone.     Coordination: Coordination normal.     Gait: Gait normal.     Deep Tendon Reflexes: Reflexes are normal and symmetric. Reflexes normal.  Psychiatric:        Mood and Affect: Mood normal.        Cognition and Memory: Cognition and memory normal.           Assessment & Plan:   Problem List Items Addressed This Visit       Cardiovascular and Mediastinum   Hypertension - Primary    bp in fair control at this time  BP Readings from Last 1 Encounters:  10/18/23 125/70   No changes needed Most recent labs reviewed  Disc lifstyle change with low sodium diet and exercise  Plan to continue  hctz 25 mg daily  Amlodipine 5 mg daily   Commended weight loss        Relevant Medications   amLODipine (NORVASC) 5 MG tablet   hydrochlorothiazide (HYDRODIURIL) 25 MG tablet   Enlargement of abdominal aorta (HCC)    Pt is agreeable to [place] to monitor now  Order done  No symptoms   Continue to monitor cholesterol       Relevant Medications   amLODipine (NORVASC) 5 MG tablet   hydrochlorothiazide (HYDRODIURIL) 25 MG tablet     Digestive   Fatty liver    Normal LFTs  24 lb weight loss since last fall- commended Has stopped drinking alcohol- also commended         Other   Prostate cancer screening    Lab Results  Component Value Date   PSA 0.83 10/11/2023   PSA 0.46 07/30/2022   PSA 0.71 07/03/2021    No family history  No significant voiding changes       Morbid obesity (HCC)    Discussed how this problem influences overall health and the risks it imposes  Reviewed plan for weight loss with lower calorie diet (via better food choices (lower glycemic and portion control) along with exercise building up to or more than 30 minutes 5 days per week including some aerobic activity and strength training   Commended on 24 lb weight loss since last fall       Hyperlipidemia    Disc goals for lipids and reasons to control them Rev last labs with pt Rev low sat fat diet in detail  LDL down to 90 with improved diet  HDL remains low (on fish oil and flax seed) Encouraged more exercise       Relevant Medications   amLODipine (NORVASC) 5 MG tablet   hydrochlorothiazide (HYDRODIURIL) 25 MG tablet   Elevated glucose level    Lab Results  Component Value Date   HGBA1C 5.3 10/11/2023   Reassuring  Has quit drinking etoh and lost weight       Colon cancer screening    Colonoscopy due for 5 y recall 10/2024

## 2023-10-18 NOTE — Assessment & Plan Note (Signed)
bp in fair control at this time  BP Readings from Last 1 Encounters:  10/18/23 125/70   No changes needed Most recent labs reviewed  Disc lifstyle change with low sodium diet and exercise  Plan to continue  hctz 25 mg daily  Amlodipine 5 mg daily   Commended weight loss

## 2023-10-18 NOTE — Patient Instructions (Addendum)
Aim for 2000 international units of vitamin D3 daily    Keep walking/ stay active  Add some strength training to your routine, this is important for bone and brain health and can reduce your risk of falls and help your body use insulin properly and regulate weight  Light weights, exercise bands , and internet videos are a good way to start  Yoga (chair or regular), machines , floor exercises or a gym with machines are also good options   I put the referral in for ultrasound of aorta /abdomen  Please let us know if you don't hear in 1-2 weeks   Keep working on healthy habits

## 2023-10-18 NOTE — Assessment & Plan Note (Signed)
Lab Results  Component Value Date   PSA 0.83 10/11/2023   PSA 0.46 07/30/2022   PSA 0.71 07/03/2021    No family history  No significant voiding changes

## 2023-10-18 NOTE — Assessment & Plan Note (Signed)
Discussed how this problem influences overall health and the risks it imposes  Reviewed plan for weight loss with lower calorie diet (via better food choices (lower glycemic and portion control) along with exercise building up to or more than 30 minutes 5 days per week including some aerobic activity and strength training   Commended on 24 lb weight loss since last fall

## 2023-10-18 NOTE — Assessment & Plan Note (Signed)
Reviewed health habits including diet and exercise and skin cancer prevention Reviewed appropriate screening tests for age  Also reviewed health mt list, fam hx and immunization status , as well as social and family history   See HPI Labs reviewed and ordered Psa is stable  Discussed fall prevention, supplements and exercise for bone density  Commended etoh cessation and intentional weight loss PHQ 0 Colonoscopy due 10/2024  Health Maintenance  Topic Date Due   COVID-19 Vaccine (3 - 2023-24 season) 10/27/2023*   Medicare Annual Wellness Visit  10/10/2024   Colon Cancer Screening  10/21/2024   DTaP/Tdap/Td vaccine (2 - Td or Tdap) 11/13/2024   Pneumonia Vaccine  Completed   Flu Shot  Completed   Hepatitis C Screening  Completed   Zoster (Shingles) Vaccine  Completed   HPV Vaccine  Aged Out  *Topic was postponed. The date shown is not the original due date.

## 2023-10-18 NOTE — Assessment & Plan Note (Signed)
Colonoscopy due for 5 y recall 10/2024

## 2023-10-18 NOTE — Assessment & Plan Note (Signed)
Lab Results  Component Value Date   HGBA1C 5.3 10/11/2023   Reassuring  Has quit drinking etoh and lost weight

## 2023-10-28 ENCOUNTER — Ambulatory Visit
Admission: RE | Admit: 2023-10-28 | Discharge: 2023-10-28 | Disposition: A | Discharge status: Home / Self Care | Payer: Medicare Other | Source: Ambulatory Visit | Attending: Family Medicine | Admitting: Family Medicine

## 2023-10-28 DIAGNOSIS — Q254 Congenital malformation of aorta unspecified: Secondary | ICD-10-CM

## 2024-08-22 ENCOUNTER — Ambulatory Visit: Admitting: Family Medicine

## 2024-08-22 ENCOUNTER — Encounter: Payer: Self-pay | Admitting: Family Medicine

## 2024-08-22 VITALS — BP 142/68 | HR 55 | Temp 97.6°F | Ht 68.5 in | Wt 267.0 lb

## 2024-08-22 DIAGNOSIS — I10 Essential (primary) hypertension: Secondary | ICD-10-CM

## 2024-08-22 DIAGNOSIS — L089 Local infection of the skin and subcutaneous tissue, unspecified: Secondary | ICD-10-CM

## 2024-08-22 DIAGNOSIS — Z23 Encounter for immunization: Secondary | ICD-10-CM

## 2024-08-22 DIAGNOSIS — M545 Low back pain, unspecified: Secondary | ICD-10-CM

## 2024-08-22 DIAGNOSIS — S80812A Abrasion, left lower leg, initial encounter: Secondary | ICD-10-CM | POA: Diagnosis not present

## 2024-08-22 DIAGNOSIS — G8929 Other chronic pain: Secondary | ICD-10-CM

## 2024-08-22 MED ORDER — CYCLOBENZAPRINE HCL 10 MG PO TABS: 5.0000 mg | ORAL_TABLET | Freq: Three times a day (TID) | ORAL | 0 refills | Status: AC | PRN

## 2024-08-22 MED ORDER — CEPHALEXIN 500 MG PO CAPS: 500.0000 mg | ORAL_CAPSULE | Freq: Two times a day (BID) | ORAL | 0 refills | Status: DC

## 2024-08-22 NOTE — Assessment & Plan Note (Signed)
 Right low back primarily  Acute on chronic  Poor rom due to pain and stiffness-worse with flex and left lateral bend  Reassuring exam and no neuro symptoms   Prescription flexeril  (has helped in past) Also referred for PT to help with pain and also spasm  In future core strength and flexibility may help  Low threshold to image if no improved

## 2024-08-22 NOTE — Patient Instructions (Addendum)
 Try the flexeril  for back pain as needed (use caution of sedation)  Heat (for 10 minutes at a time) may help also   I put the referral in for physical therapy Please let us  know if you don't hear in 1-2 weeks to set that up (mychart message or call or letter)   You can also take a look at the handout for back stretches   Take keflex for leg skin infection Keep area clean with soap and water (do not scrub)  Elevate feet when you sit if you can   If the redness/swelling do not improve (or worsen) let us  know   Support hose/socks during the day (not at night) are very helpful  Follow up when able for annual exam

## 2024-08-22 NOTE — Assessment & Plan Note (Signed)
 BP: (!) 142/68  Weight gain noted   Hydrochlorothiazide  25 mg daily  Amlodipine  5 mg daily (may be adding to pedal edema)   Plan follow up for re check  Encouraged better health habits

## 2024-08-22 NOTE — Progress Notes (Signed)
 Subjective:    Patient ID: Eric Spears, male    DOB: Apr 23, 1956, 68 y.o.   MRN: [id number]  HPI  Wt Readings from Last 3 Encounters:  08/22/24 267 lb (121.1 kg)  10/18/23 250 lb 2 oz (113.5 kg)  10/11/23 250 lb (113.4 kg)   40.01 kg/m  Vitals:   08/22/24 0802  BP: (!) 142/68  Pulse: (!) 55  Temp: 97.6 F (36.4 C)  SpO2: 97%    Pt presents for  Leg injury/ redness  Leg swelling  (has not been wearing supp hose)  Back pain   Had some bumps on his left lower leg They itched and he scratched them  (some bleeding -washed it)  Then developed redness   Has had shingrix vaccines   Feet and ankles are puffy/ baseline   No fever No pain/ache     Last tetanus shot 2016  Back is bothering him  Gets a catch in his low back when he gets up from sitting or lying down  Right low back  Used to take a muscle relaxer    Has been trying to walk daily -was a mile/now a half mile      Patient Active Problem List   Diagnosis Date Noted   Abrasion, leg w/ infection 08/22/2024   Abnormality of abdominal aorta 10/18/2023   Hyperlipidemia 11/01/2018   Hypertension 10/03/2018   Morbid obesity (HCC) 10/03/2018   Enlargement of abdominal aorta 12/26/2017   Fatty liver 10/19/2017   Elevated glucose level 10/18/2016   Somnolence, daytime 11/14/2014   Routine general medical examination at a health care facility 11/05/2014   Prostate cancer screening 11/05/2014   Colon cancer screening 07/03/2014   Low back pain 03/17/2011   Past Medical History:  Diagnosis Date   Allergy    BPH (benign prostatic hyperplasia)    Depression    ED (erectile dysfunction)    Elevated liver function tests    GERD (gastroesophageal reflux disease)    Hypertension    Low HDL (under 40) 10/05   Past Surgical History:  Procedure Laterality Date   Finger trauma     NASAL SEPTUM SURGERY     Nose   Stress cardiolite  1998   Normal   Social History   Tobacco Use   Smoking status:  Never   Smokeless tobacco: Never  Substance Use Topics   Alcohol use: Yes    Alcohol/week: 2.0 standard drinks of alcohol    Types: 2 Standard drinks or equivalent per week    Comment: occ   Drug use: No   Family History  Problem Relation Age of Onset   Heart disease Father        CVA   Cancer Other        were smokers   Cancer Mother        uterine   Heart disease Mother        valve dysfunction in heart   Diabetes Maternal Grandmother    Obesity Other    Colon polyps Cousin        first cousins maternal and paternal   Colon cancer Other 66       mothers first cousin   Rectal cancer Neg Hx    Stomach cancer Neg Hx    Allergies  Allergen Reactions   Bee Venom    Current Outpatient Medications on File Prior to Visit  Medication Sig Dispense Refill   amLODipine  (NORVASC ) 5 MG tablet Take 1 tablet (  5 mg total) by mouth daily. 90 tablet 3   aspirin 325 MG EC tablet Take 325 mg by mouth 2 (two) times daily.      cetirizine (ZYRTEC) 10 MG tablet Take 10 mg by mouth daily as needed.       hydrochlorothiazide  (HYDRODIURIL ) 25 MG tablet Take 1 tablet (25 mg total) by mouth daily. 90 tablet 3   Menaquinone-7 (K2 PO) Take 1 tablet by mouth daily.     Multiple Vitamin (MULTIVITAMIN) tablet Take 1 tablet by mouth daily.       OVER THE COUNTER MEDICATION B complex vitamin. One tablet daily.     OVER THE COUNTER MEDICATION Fish Oil, one capsule daily.     OVER THE COUNTER MEDICATION Flaxseed, one tablet daily.     VITAMIN E PO Take 1 tablet by mouth daily.     No current facility-administered medications on file prior to visit.    Review of Systems  Constitutional:  Negative for activity change, appetite change, fatigue, fever and unexpected weight change.  HENT:  Negative for congestion, rhinorrhea, sore throat and trouble swallowing.   Eyes:  Negative for pain, redness, itching and visual disturbance.  Respiratory:  Negative for cough, chest tightness, shortness of breath and  wheezing.   Cardiovascular:  Negative for chest pain and palpitations.  Gastrointestinal:  Negative for abdominal pain, blood in stool, constipation, diarrhea and nausea.  Endocrine: Negative for cold intolerance, heat intolerance, polydipsia and polyuria.  Genitourinary:  Negative for difficulty urinating, dysuria, frequency and urgency.  Musculoskeletal:  Positive for back pain. Negative for arthralgias, joint swelling and myalgias.  Skin:  Positive for wound. Negative for pallor and rash.  Neurological:  Negative for dizziness, tremors, weakness, numbness and headaches.  Hematological:  Negative for adenopathy. Does not bruise/bleed easily.  Psychiatric/Behavioral:  Negative for decreased concentration and dysphoric mood. The patient is not nervous/anxious.        Objective:   Physical Exam Constitutional:      General: He is not in acute distress.    Appearance: Normal appearance. He is well-developed. He is obese. He is not ill-appearing or diaphoretic.  HENT:     Head: Normocephalic and atraumatic.  Eyes:     Conjunctiva/sclera: Conjunctivae normal.     Pupils: Pupils are equal, round, and reactive to light.  Neck:     Thyroid : No thyromegaly.     Vascular: No carotid bruit or JVD.  Cardiovascular:     Rate and Rhythm: Regular rhythm. Bradycardia present.     Heart sounds: Normal heart sounds.     No gallop.  Pulmonary:     Effort: Pulmonary effort is normal. No respiratory distress.     Breath sounds: Normal breath sounds. No wheezing or rales.  Abdominal:     General: There is no distension or abdominal bruit.     Palpations: Abdomen is soft.  Musculoskeletal:     Cervical back: Normal range of motion and neck supple.     Lumbar back: Spasms and tenderness present. No swelling, deformity or bony tenderness. Decreased range of motion. Negative right straight leg raise test and negative left straight leg raise test. No scoliosis.     Right lower leg: Edema present.      Left lower leg: Edema present.     Comments: Tender in LS musculature /worse on right No bony tenderness Pain worse with flex and left lateral bend  No neuro changes   One plus pedal edema  Lymphadenopathy:     Cervical: No cervical adenopathy.  Skin:    General: Skin is warm and dry.     Coloration: Skin is not pale.     Findings: No rash.     Comments: LLE Abrasion with 3-4 cm area of erythema and focal swelling Minimally tender No bleeding or weeping   Neurological:     Mental Status: He is alert.     Coordination: Coordination normal.     Deep Tendon Reflexes: Reflexes are normal and symmetric. Reflexes normal.  Psychiatric:        Mood and Affect: Mood normal.           Assessment & Plan:   Problem List Items Addressed This Visit       Cardiovascular and Mediastinum   Hypertension   BP: (!) 142/68  Weight gain noted   Hydrochlorothiazide  25 mg daily  Amlodipine  5 mg daily (may be adding to pedal edema)   Plan follow up for re check  Encouraged better health habits         Musculoskeletal and Integument   Abrasion, leg w/ infection - Primary   Started after scratching some itchy papules on LLE -abrasion from this looks to be mildly infected  Prescription keflex 500 mg bid given  Encouraged to keep clean with soap /water (do not submerge) Supp hose may help in light of ankle swelling (baseline) Watch for increase redness/pain / swelling  Update if not starting to improve in a week or if worsening  Call back and Er precautions noted in detail today   Td updated       Relevant Medications   cephALEXin (KEFLEX) 500 MG capsule   Other Relevant Orders   Td : Tetanus/diphtheria >7yo Preservative  free (Completed)     Other   Low back pain   Right low back primarily  Acute on chronic  Poor rom due to pain and stiffness-worse with flex and left lateral bend  Reassuring exam and no neuro symptoms   Prescription flexeril  (has helped in past) Also  referred for PT to help with pain and also spasm  In future core strength and flexibility may help  Low threshold to image if no improved      Relevant Medications   cyclobenzaprine  (FLEXERIL ) 10 MG tablet   Other Relevant Orders   Ambulatory referral to Physical Therapy   Other Visit Diagnoses       Need for influenza vaccination       Relevant Orders   Flu vaccine HIGH DOSE PF(Fluzone Trivalent) (Completed)

## 2024-08-22 NOTE — Assessment & Plan Note (Signed)
 Started after scratching some itchy papules on LLE -abrasion from this looks to be mildly infected  Prescription keflex 500 mg bid given  Encouraged to keep clean with soap /water (do not submerge) Supp hose may help in light of ankle swelling (baseline) Watch for increase redness/pain / swelling  Update if not starting to improve in a week or if worsening  Call back and Er precautions noted in detail today   Td updated

## 2024-08-29 NOTE — Therapy (Incomplete)
 OUTPATIENT PHYSICAL THERAPY THORACOLUMBAR EVALUATION   Patient Name: Eric Spears MRN: [id number] DOB:[birth year], 68 y.o., male Today's Date: 08/29/2024  END OF SESSION:   Past Medical History:  Diagnosis Date   Allergy    BPH (benign prostatic hyperplasia)    Depression    ED (erectile dysfunction)    Elevated liver function tests    GERD (gastroesophageal reflux disease)    Hypertension    Low HDL (under 40) 10/05   Past Surgical History:  Procedure Laterality Date   Finger trauma     NASAL SEPTUM SURGERY     Nose   Stress cardiolite  1998   Normal   Patient Active Problem List   Diagnosis Date Noted   Abrasion, leg w/ infection 08/22/2024   Abnormality of abdominal aorta 10/18/2023   Hyperlipidemia 11/01/2018   Hypertension 10/03/2018   Morbid obesity (HCC) 10/03/2018   Enlargement of abdominal aorta 12/26/2017   Fatty liver 10/19/2017   Elevated glucose level 10/18/2016   Somnolence, daytime 11/14/2014   Routine general medical examination at a health care facility 11/05/2014   Prostate cancer screening 11/05/2014   Colon cancer screening 07/03/2014   Low back pain 03/17/2011    PCP: [name], MD  REFERRING PROVIDER: [name], MD  REFERRING DIAG:  M54.50,G89.29 (ICD-10-CM) - Chronic right-sided low back pain without sciatica  Rationale for Evaluation and Treatment: Rehabilitation  THERAPY DIAG:  No diagnosis found.  ONSET DATE: ***  SUBJECTIVE:                                                                                                                                                                                           SUBJECTIVE STATEMENT: ***  PERTINENT HISTORY:  Per MD note on 08/22/24, patient reports back is bothering him and gets a catch in his R low back when he gets up from sitting or lying down.   PAIN:  Are you having pain? {OPRCPAIN:27236}  PRECAUTIONS: None  RED FLAGS: {PT Red Flags:29287}   WEIGHT  BEARING RESTRICTIONS: No  FALLS:  Has patient fallen in last 6 months? {fallsyesno:27318}  LIVING ENVIRONMENT: Lives with: {[facility]RC lives with:25569::lives with their family} Lives in: {Lives in:25570} Stairs: {opstairs:27293} Has following equipment at home: {Assistive devices:23999}  OCCUPATION: ***  PLOF: {PLOF:24004}  PATIENT GOALS: ***  OBJECTIVE:  Note: Objective measures were completed at Evaluation unless otherwise noted.  DIAGNOSTIC FINDINGS:  N/A   PATIENT SURVEYS:  Modified Oswestry:  MODIFIED OSWESTRY DISABILITY SCALE  Date: 08/30/24 Score  Pain intensity {ODI 1:32962}  2. Personal care (washing, dressing, etc.) {ODI 2:32963}  3. Lifting {ODI 3:32964}  4. Walking {ODI 4:32965}  5. Sitting {ODI 5:32966}  6. Standing {ODI 6:32967}  7. Sleeping {ODI 7:32968}  8. Social Life {ODI 8:32969}  9. Traveling {ODI 9:32970}  10. Employment/ Homemaking {ODI 10:32971}  Total ***/50   Interpretation of scores: Score Category Description  0-20% Minimal Disability The patient can cope with most living activities. Usually no treatment is indicated apart from advice on lifting, sitting and exercise  21-40% Moderate Disability The patient experiences more pain and difficulty with sitting, lifting and standing. Travel and social life are more difficult and they may be disabled from work. Personal care, sexual activity and sleeping are not grossly affected, and the patient can usually be managed by conservative means  41-60% Severe Disability Pain remains the main problem in this group, but activities of daily living are affected. These patients require a detailed investigation  61-80% Crippled Back pain impinges on all aspects of the patient's life. Positive intervention is required  81-100% Bed-bound These patients are either bed-bound or exaggerating their symptoms  [name], et al. Surgery versus conservative management of stable thoracolumbar fracture:  the PRESTO feasibility RCT. [place] ([place]): [employer]; 2021 Nov. [employer] Technology Assessment, No. 25.62.) Appendix 3, Oswestry Disability Index category descriptors. Available from: [employer]cz  Minimally Clinically Important Difference (MCID) = 12.8%  COGNITION: Overall cognitive status: {cognition:24006}     SENSATION: {sensation:27233}  MUSCLE LENGTH: Hamstrings: Right *** deg; Left *** deg Debby test: Right *** deg; Left *** deg  POSTURE: {posture:25561}  PALPATION: ***  LUMBAR ROM:   AROM eval  Flexion   Extension   Right lateral flexion   Left lateral flexion   Right rotation   Left rotation    (Blank rows = not tested)  LOWER EXTREMITY ROM:     {AROM/PROM:27142}  Right eval Left eval  Hip flexion    Hip extension    Hip abduction    Hip adduction    Hip internal rotation    Hip external rotation    Knee flexion    Knee extension    Ankle dorsiflexion    Ankle plantarflexion    Ankle inversion    Ankle eversion     (Blank rows = not tested)  LOWER EXTREMITY MMT:    MMT Right eval Left eval  Hip flexion    Hip extension    Hip abduction    Hip adduction    Hip internal rotation    Hip external rotation    Knee flexion    Knee extension    Ankle dorsiflexion    Ankle plantarflexion    Ankle inversion    Ankle eversion     (Blank rows = not tested)  LUMBAR SPECIAL TESTS:  {lumbar special test:25242}  FUNCTIONAL TESTS:  5 times sit to stand: *** 6 minute walk test: ***  GAIT: Distance walked: *** Assistive device utilized: {Assistive devices:23999} Level of assistance: {Levels of assistance:24026} Comments: ***  TREATMENT DATE: 08/29/24  PATIENT EDUCATION:  Education details: HEP, POC, goals  Person educated: Patient Education method: Explanation,  Demonstration, and Handouts Education comprehension: verbalized understanding and returned demonstration  HOME EXERCISE PROGRAM: ***  ASSESSMENT:  CLINICAL IMPRESSION: Patient is a 68 y.o. male who was seen today for physical therapy evaluation and treatment for ***.   OBJECTIVE IMPAIRMENTS: {opptimpairments:25111}.   ACTIVITY LIMITATIONS: {activitylimitations:27494}  PARTICIPATION LIMITATIONS: {participationrestrictions:25113}  PERSONAL FACTORS: {Personal factors:25162} are also affecting patient's functional outcome.   REHAB POTENTIAL: Good  CLINICAL DECISION MAKING: Stable/uncomplicated  EVALUATION COMPLEXITY: Low   GOALS: Goals reviewed with patient? Yes  SHORT TERM GOALS: Target date: 09/27/2024  Patient will be independent in HEP to improve strength/mobility for better functional independence with ADLs. Baseline: Goal status: INITIAL   LONG TERM GOALS: Target date: 10/25/2024  *** Baseline:  Goal status: INITIAL  2.  *** Baseline:  Goal status: INITIAL  3.  *** Baseline:  Goal status: INITIAL  4.  *** Baseline:  Goal status: INITIAL  5.  *** Baseline:  Goal status: INITIAL  6.  *** Baseline:  Goal status: INITIAL  PLAN:  PT FREQUENCY: 1-2x/week  PT DURATION: 8 weeks  PLANNED INTERVENTIONS: {rehab planned interventions:25118::97110-Therapeutic exercises,97530- Therapeutic [phone]- Neuromuscular re-education,97535- Self Rjmz,02859- Manual therapy,Patient/Family education}.  PLAN FOR NEXT SESSION: ***   Maryanne Finder, PT, DPT Physical Therapist - [place]  [facility] 08/29/2024, 12:47 PM

## 2024-08-30 ENCOUNTER — Ambulatory Visit: Attending: Family Medicine

## 2024-08-30 DIAGNOSIS — M545 Low back pain, unspecified: Secondary | ICD-10-CM | POA: Diagnosis not present

## 2024-08-30 DIAGNOSIS — M546 Pain in thoracic spine: Secondary | ICD-10-CM | POA: Diagnosis present

## 2024-08-30 DIAGNOSIS — G8929 Other chronic pain: Secondary | ICD-10-CM | POA: Insufficient documentation

## 2024-08-30 DIAGNOSIS — M5459 Other low back pain: Secondary | ICD-10-CM | POA: Diagnosis present

## 2024-08-30 NOTE — Therapy (Deleted)
 OUTPATIENT PHYSICAL THERAPY EVALUATION   Patient Name: Eric Spears MRN: [id number] DOB:01/06/1956, 68 y.o., male Today's Date: 08/30/2024  END OF SESSION:   Past Medical History:  Diagnosis Date   Allergy    BPH (benign prostatic hyperplasia)    Depression    ED (erectile dysfunction)    Elevated liver function tests    GERD (gastroesophageal reflux disease)    Hypertension    Low HDL (under 40) 10/05   Past Surgical History:  Procedure Laterality Date   Finger trauma     NASAL SEPTUM SURGERY     Nose   Stress cardiolite  1998   Normal   Patient Active Problem List   Diagnosis Date Noted   Abrasion, leg w/ infection 08/22/2024   Abnormality of abdominal aorta 10/18/2023   Hyperlipidemia 11/01/2018   Hypertension 10/03/2018   Morbid obesity (HCC) 10/03/2018   Enlargement of abdominal aorta 12/26/2017   Fatty liver 10/19/2017   Elevated glucose level 10/18/2016   Somnolence, daytime 11/14/2014   Routine general medical examination at a health care facility 11/05/2014   Prostate cancer screening 11/05/2014   Colon cancer screening 07/03/2014   Low back pain 03/17/2011    PCP: [name], MD  REFERRING PROVIDER: [name], MD  REFERRING DIAG: chronic right-sided low back pain without sciatica  Rationale for Evaluation and Treatment: Rehabilitation  THERAPY DIAG:  No diagnosis found.  ONSET DATE: ***  SUBJECTIVE:                                                                                                                                                                                           SUBJECTIVE STATEMENT: Patient is here for physical therapy evaluation of chronic low back pain.   Per MD note on 08/22/24, patient reports back is bothering him and gets a catch in his R low back when he gets up from sitting or lying down.   What are your expectations for today? *** Manner of onset (traumatic, sudden, insidious): ***. When did it  start? *** Related signs and symptoms: *** Previous episodes: *** Occupation: *** Recreational Activities and hobbies: *** Functional limitations: ***  PAIN: Are you having pain? Yes NPRS: Current: ***/10,  Best: ***/10, Worst: ***/10. Pain location: *** Pain description: *** . Numbness/tingling: *** Aggravating factors: *** Relieving factors: ***  24 hour clock: *** Irritability of the pain or symptoms:  Time to onset (T1): ***,  Time to quit (T2): *** Time to feel better (T3): ***  PERTINENT HISTORY:  Patient is a 68 y.o. male who presents to outpatient physical therapy with a referral for medical  diagnosis chronic right-sided low back pain without sciatica. This patient's chief complaints consist of ***, leading to the following functional deficits: ***. Relevant past medical history and comorbidities include the following: he has Low back pain; Somnolence, daytime; Elevated glucose level; Fatty liver; Enlargement of abdominal aorta; Hypertension; Morbid obesity (HCC); Hyperlipidemia; Abnormality of abdominal aorta; and Abrasion, leg w/ infection on their problem list. he  has a past medical history of Allergy, BPH (benign prostatic hyperplasia), Depression, ED (erectile dysfunction), Elevated liver function tests, GERD (gastroesophageal reflux disease), Hypertension, and Low HDL (under 40) (10/05). he  has a past surgical history that includes Nasal septum surgery; and Finger trauma. Patient denies hx of {redflags:27294}  Exercise history: ***  PAIN:  Are you having pain? {OPRCPAIN:27236}  PRECAUTIONS: {Therapy precautions:24002}  WEIGHT BEARING RESTRICTIONS: {Yes ***/No:24003}  RED FLAGS: Patient denies hx of {redflags:27294}   FALLS:  Has patient fallen in last 6 months? {fallsyesno:27318}  LIVING ENVIRONMENT: Lives with: {OPRC lives with:25569::lives with their family} Lives in: {Lives in:25570} Stairs: {opstairs:27293} Has following equipment at home: {Assistive  devices:23999}  PLOF: {PLOF:24004}  PATIENT GOALS: ***  NEXT MD VISIT: ***  OBJECTIVE  DIAGNOSTIC FINDINGS:  None available per chart review on 08/30/24  SELF-REPORTED FUNCTION {PROS:32229}  STANDING Observation Posture Seated:  Standing: Movement patterns and painful movements Sit <> stand:  Bed mobility:  Other:  AROM Lumbar AROM Flexion: *** Extension: *** Side Flexion: R: *** L: *** Rotation: R: *** L: *** Side glide:  R: *** L: ***  Traction Alleviation Test *** Standing Lower Myotome Screen Single leg stance heel raise 1x10 with B UE hand held support:  R:  L:  Walking Regionalization (as needed) Flexion:  Sidebending:  R:  L:   SITTING Neurological Testing Neural Tension Testing Slump Flexion based  R: *** L: *** Extension based  R: L:  Reflexes if high load sensitivity Patellar Reflex (L3-L4):  R: *** L: ***  Hamstring Reflex (L5) R: *** L: ***  Achilles Reflex (S1) R: *** L: ***  SUPINE Neurological Testing Neural Tension Testing Straight Leg Raise:  From supine R: *** L: *** With pre-flexed hip/knee R: *** L: *** Bowstring test:  R: *** L: *** Reflex Testing Patellar Reflex (L3-L4):  R: *** L: ***  Place bolster under knees and grasp patellar tendon, induce reflex at patellar tendon  Alternative: Support distal thighs with arm under knees, induce reflex at patellar tendon  Hamstring Reflex (L5) R: *** L: ***  Place bolster under knees and place hip in slight ER, grasp medial HS tendon and compress, then add stretch by dragging distally, strike fingers directly over tendon.  Alternative: Support distal thigh with one knee under the patient's ipsilateral knee, position hip in slight ER, grasp medial HS tendon and compress, then add stretch by dragging distally, strike fingers directly over tendon.  Achilles Reflex (S1) R: *** L: ***  Place bolster under knees  Hold by lateral (5th), MTP joint of foot, lift  enough to free heel from table  ? Stand to see area of strike (next to foot, not behind)  ? Strike lateral edge of foot just below fingers for symmetrical strike  Alternative: Preposition into 90/90 hip/knee flexion  ? Rest leg on outside of forearm  ? Hold by lateral (5th), MTP joint of foot  ? Stand to see area of strike (next to foot, not behind)  ? Strike lateral edge of foot just below fingers for symmetrical strike  Adductor R: *** L: ***  Knee bent, externally rotated and abducted  ? Tendon is fairly high on adductor  ? Push in with thumb and strike thumb with hammer  ? Can resist adduction to make tendon pop to find sweet spot  Myotome Testing Cleanest Lumbar Primary and Secondary Myotomes for testing  L3  Quad   L4  Anterior Tibialis  Test quadriceps   L5  EHL  Test hamstrings   S1  Ankle Eversion  Test hip abductors   S2  Gastrocnemius   Ankle Eversion (S1) R: *** L: ***   ? Stand on the same side of the patient's testing LE ? Patient slides to the edge of the table ? Caudal arm cups the patient's foot (fingers on plantar surface of foot, thumb on dorsal foot), shape hand to foot to increase contact area ? Cranial arm cradles along patient's medial lower leg to stabilize with break testing ? Tuck caudal arm's elbow into hip, square legs with shoulders, toes pointing out ? Ask the patient to move outside of foot out toward you ? "123, Hold" ? Use lunge stance (push off the back heel) to perform the break test in direction of inversion Anterior Tibialis (L4-L5) R: *** L: ***   ? Stand on the opposite side of the patient's testing LE ? Patient slides to the edge of the table ? Cranial arm cradles along patient's lateral lower leg to stabilize with break testing ? Caudal arm cups the patient's foot (fingers on plantar surface of foot, thumb on dorsal foot), shape hand to foot to increase contact area ? Tuck the caudal arm's elbow into side, square legs with shoulders,  toes pointing out ? Ask the patient to move ankle up and inward ? "123, Hold" ? Use lunge stance (push off the back heel) to perform break test in direction of PF and eversion Extensor Hallucis Longus (L5) R: *** L: ***   ? Can test both simultaneously ? Stand at the foot of the table ? Ask the patient to pull great toes up towards shin ? "123, hold" ? Perform brake test in the direction of toe flexion by use thumbs to pull down into flexion with 1st finger curled and stabilizing on ball of foot ? If unclean, test unilaterally and use thenar to break one at a time. Additional Myotome Testing Quadriceps (L3) R: *** L: ***   ? If anterior tibialis is weak (L4), assess level above for involvement, if tolerated ? Especially important if the patient reports intermittent buckling of the LE and demonstrates weakness with anterior tibialis ? NOT a break test ? Safety test for falls ? Seated, popliteal space against table for fulcrum, patient leaning back onto hands ? "123, hold" ? Stabilize at hip and press down on ankle by dropping body weight Hamstring (L5) R: *** L: ***   ? Can test if weakness noted with EHL ? Supine, stand on the same side as the testing LE ? Bring the LE (passively) into hip flexion, and max knee flexion ? Hold the patient's knee and cup the patient's ankle ? Ask the patient to don't let you pull the foot up ? Pull into knee extension ? looking for increase in strength like biceps ? NOTE: Caution with cramping ? Hip Abductors (L5, S1) R: *** L: ***   ? Can test if weakness noted with ankle eversion ? Side Lying with hip in neutral rotation, slight extension ? Resist hip abduction ? Stand in front to see patients face  SIDELYING Palpation Muscle irritability and guarding QL, multifidi, erector spinae, glute medius (cranial and posterior to greater trochanter)  PRONE Prone Iliopsoas Length Assessment (manual)  R: *** (stiffness and end feel) L: *** ?  Patient prone, both legs on table,  ? Pillow under belly, above ASIS  ? Stand on the opposite side of table, wide stance, leaning on table  ? Stabilize with ulnar border of cranial hand at ischial tuberosity  ? Caudal hand grasps under patient's distal thigh  ? Compress and drag ischium inferiorly ? lift thigh into extension  ? Assess for end-feel and compare to opposite side  To find the ischial tuberosity, just go below gluteal fold and press in medially (more medial than you think)  ? If you don't stabilize well at ischium, you'll get lumbar extension.  ? A normal muscular end feel should be elastic  ? Common to find a dysfunctional, quicker, firmer stop in psoas. This results in pulling on the spine.  Anterior capsule differentiation with sidebending as needed  Prone Iliopsoas Length (with table)  R: *** L: *** For big heavy legs  ? Patient prone, pillow above ASIS, gluteal fold at the crease of the table  ? Opposite LE off the side of the table with hip/knee flexed, foot under hip (use a wedge or your foot to stabilize the patient's foot on the ground)  ? If the hip rolls up off the table, then it's stiff.  ? If hip is down still, Lift the end of the table until hip comes up (watch the pelvis and spine for extension)  ? Use ulnar border of cranial hand reinforced by caudal hand to direct anterior/inferior force at proximal femur  ? Assess for end-feel and compare to opposite side    NOTE: Less perceivable stretch sensation of RF than psoas is common  ? Psoas can be a source of pain.  ? Usually described at lower abdominal or groin pain when walking, running, or doing anything that uses psoas.  ? You can palpate psoas, no one will like it, but if it is the source of pain, it will be terrible.    TREATMENT                                                                                                                               PATIENT EDUCATION:  Education details:  *** Person educated: {Person educated:25204} Education method: {Education Method:25205} Education comprehension: {Education Comprehension:25206}  HOME EXERCISE PROGRAM: ***  ASSESSMENT:  CLINICAL IMPRESSION: Patient is a 68 y.o. male referred to outpatient physical therapy with a medical diagnosis of chronic right-sided low back pain without sciatica who presents with signs and symptoms consistent with ***. Patient presents with significant *** impairments that are limiting ability to complete *** without difficulty. Patient will benefit from skilled physical therapy intervention to address current body structure impairments and activity limitations to improve function and work towards goals set in current  POC in order to return to prior level of function or maximal functional improvement.   Mechanical sensitivities: ***   OBJECTIVE IMPAIRMENTS: {opptimpairments:25111}.   ACTIVITY LIMITATIONS: {activitylimitations:27494}  PARTICIPATION LIMITATIONS: {participationrestrictions:25113}  PERSONAL FACTORS: {Personal factors:25162} are also affecting patient's functional outcome.   REHAB POTENTIAL: {rehabpotential:25112}  CLINICAL DECISION MAKING: {clinical decision making:25114}  EVALUATION COMPLEXITY: {Evaluation complexity:25115}   GOALS: Goals reviewed with patient? {yes/no:20286}  SHORT TERM GOALS: Target date: 09/13/2024  Patient will be independent with initial home exercise program for self-management of symptoms. Baseline: {HEPbaseline4:27310} (08/30/24); Goal status: INITIAL  LONG TERM GOALS: Target date: 11/22/2024  Patient will be independent with a long-term home exercise program for self-management of symptoms.  Baseline: {HEPbaseline4:27310} (08/30/24); Goal status: INITIAL  2.  Patient will demonstrate improved {SarasLTGPRO:32233} to demonstrate improvement in overall condition and self-reported functional ability.  Baseline: {Sarasgoalbaseline:32234}  (08/30/24); Goal status: INITIAL  3.  *** Baseline: {Sarasgoalbaseline:32234} (08/30/24); Goal status: INITIAL  4.  *** Baseline: {Sarasgoalbaseline:32234} (08/30/24); Goal status: INITIAL  5.  Patient will demonstrate improvement in Patient Specific Functional Scale (PSFS) of equal or greater than 8/10 points to reflect clinically significant improvement in patient's most valued functional activities. Baseline: {Sarasgoalbaseline:32234} (08/30/24); Goal status: INITIAL  6.  Patient will report NPRS equal or less than 3/10 during functional activities during the last 2 weeks to improve their abilitly to complete community, work and/or recreational activities with less limitation. Baseline: ***/10 (08/30/24); Goal status: INITIAL   PLAN:  PT FREQUENCY: {rehab frequency:25116}  PT DURATION: {rehab duration:25117}  PLANNED INTERVENTIONS: {rehab planned interventions:25118::97110-Therapeutic exercises,97530- Therapeutic [phone]- Neuromuscular re-education,97535- Self Rjmz,02859- Manual therapy,Patient/Family education}.  PLAN FOR NEXT SESSION: ***   Camie SAUNDERS. Juli, PT, DPT, Cert. MDT 08/30/24, 9:18 AM  [facility] [address] [place], [place] [address] P: [phone] I F: [phone]

## 2024-08-30 NOTE — Therapy (Signed)
 OUTPATIENT PHYSICAL THERAPY THORACOLUMBAR EVALUATION   Patient Name: Eric Spears MRN: [id number] DOB:04/20/1956, 68 y.o., male Today's Date: 08/30/2024  END OF SESSION:  PT End of Session - 08/30/24 1435     Visit Number 1    Number of Visits 17    Date for Recertification  10/26/24    PT Start Time 1435    PT Stop Time 1520    PT Time Calculation (min) 45 min    Activity Tolerance Patient tolerated treatment well    Behavior During Therapy [facility] for tasks assessed/performed          Past Medical History:  Diagnosis Date   Allergy    BPH (benign prostatic hyperplasia)    Depression    ED (erectile dysfunction)    Elevated liver function tests    GERD (gastroesophageal reflux disease)    Hypertension    Low HDL (under 40) 10/05   Past Surgical History:  Procedure Laterality Date   Finger trauma     NASAL SEPTUM SURGERY     Nose   Stress cardiolite  1998   Normal   Patient Active Problem List   Diagnosis Date Noted   Abrasion, leg w/ infection 08/22/2024   Abnormality of abdominal aorta 10/18/2023   Hyperlipidemia 11/01/2018   Hypertension 10/03/2018   Morbid obesity (HCC) 10/03/2018   Enlargement of abdominal aorta 12/26/2017   Fatty liver 10/19/2017   Elevated glucose level 10/18/2016   Somnolence, daytime 11/14/2014   Routine general medical examination at a health care facility 11/05/2014   Prostate cancer screening 11/05/2014   Colon cancer screening 07/03/2014   Low back pain 03/17/2011    PCP: [name], MD   REFERRING PROVIDER: [name], MD   REFERRING DIAG: Referring dx: chronic right-sided low back pain without sciatica  Rationale for Evaluation and Treatment: Rehabilitation  THERAPY DIAG:  Other low back pain - Plan: PT plan of care cert/re-cert  Pain in thoracic spine - Plan: PT plan of care cert/re-cert  ONSET DATE: 2-3 months ago, pt was working and picked up some limbs and twisted.   SUBJECTIVE:                                                                                                                                                                                            SUBJECTIVE STATEMENT: Low back pain   PERTINENT HISTORY:  Low back pain R side.  Pain started a few years ago. Pt sleeps on his R side. Back bothers him when he changes position such as getting up in the morning. Doctor thinks the pain is  muscular. Has had low back pain since he was about 68 years old, pain comes and goes.   No LE radiating symptoms.      Blood pressure is controlled per pt.    PAIN:  Are you having pain? Yes: NPRS scale: 0/10 currently Pain location: R thoracolumbar paraspinal area and R iliac crest.  Pain description: sharp, radiating, electrical  Aggravating factors: changing positions such as getting up from the floor. Getting down to the ground and getting up from the ground. Getting hit from the side. Pain bothers him first thing in the morning. Relieving factors: sleeping on a recliner; supine with feet propped on pillows.  Reclined position  PRECAUTIONS: no known precautions.   RED FLAGS: Bowel or bladder incontinence: No and Cauda equina syndrome: Yes: when sitting on a toilet or something hard; rarely ever happens.     WEIGHT BEARING RESTRICTIONS: No  FALLS:  Has patient fallen in last 6 months? No  LIVING ENVIRONMENT: Lives with: lives alone Lives in: House/apartment Stairs: Yes: External: 4 steps; on left going up Has following equipment at home: None  OCCUPATION: retired  PLOF: Independent  PATIENT GOALS: make the pain go away or find out what it is.   NEXT MD VISIT: later on this year  OBJECTIVE:  Note: Objective measures were completed at Evaluation unless otherwise noted.  DIAGNOSTIC FINDINGS:    PATIENT SURVEYS:  Modified Oswestry:  MODIFIED OSWESTRY DISABILITY SCALE  Date: 08/30/2024 Score  Pain intensity 0 = I can tolerate the pain I have without having to use  pain medication.  2. Personal care (washing, dressing, etc.) 0 =  I can take care of myself normally without causing increased pain.  3. Lifting 0 = I can lift heavy weights without increased pain.  4. Walking 0 = Pain does not prevent me from walking any distance  5. Sitting 0 =  I can sit in any chair as long as I like.  6. Standing 0 =  I can stand as long as I want without increased pain.  7. Sleeping 0 = Pain does not prevent me from sleeping well.  8. Social Life 0 = My social life is normal and does not increase my pain.  9. Traveling 0 =  I can travel anywhere without increased pain.  10. Employment/ Homemaking 1 = My normal homemaking/job activities increase my pain, but I can still perform all that is required of me  Total 1/50 (2%)   Interpretation of scores: Score Category Description  0-20% Minimal Disability The patient can cope with most living activities. Usually no treatment is indicated apart from advice on lifting, sitting and exercise  21-40% Moderate Disability The patient experiences more pain and difficulty with sitting, lifting and standing. Travel and social life are more difficult and they may be disabled from work. Personal care, sexual activity and sleeping are not grossly affected, and the patient can usually be managed by conservative means  41-60% Severe Disability Pain remains the main problem in this group, but activities of daily living are affected. These patients require a detailed investigation  61-80% Crippled Back pain impinges on all aspects of the patient's life. Positive intervention is required  81-100% Bed-bound These patients are either bed-bound or exaggerating their symptoms  [name], et al. Surgery versus conservative management of stable thoracolumbar fracture: the PRESTO feasibility RCT. [place] ([place]): [employer]; 2021 Nov. [facility] Technology Assessment, No. 25.62.) Appendix 3, Oswestry Disability Index category  descriptors. Available from: [employer].cz  Minimally Clinically Important Difference (MCID) = 12.8%  COGNITION: Overall cognitive status: Within functional limits for tasks assessed     SENSATION:   MUSCLE LENGTH:   POSTURE: B protracted shoulders, R scapular protraction > L, thoracic kyphosis, movement preference around L3/4 area with R trunk rotation, B genu valgus and B foot pronation.   PALPATION: Muscle tension R thoracolumbar area.   LUMBAR ROM:   AROM eval  Flexion Limited, slight aberrant movement/pt about to walk up with hands when returning to neutral.   Extension Limited with thoracolumbar spine tightness similar to area of pain  Right lateral flexion Limited with R iliac crest area tightness, focused around L5/S1 area  Left lateral flexion Limited with R iliac crest area tightness  Right rotation Limited with R thoracolumbar paraspinal tightness  Left rotation Limited with L posterior trunk tightness.    (Blank rows = not tested)  LOWER EXTREMITY ROM:     Passive  Right eval Left eval  Hip flexion    Hip extension    Hip abduction    Hip adduction    Hip internal rotation    Hip external rotation    Knee flexion    Knee extension    Ankle dorsiflexion    Ankle plantarflexion    Ankle inversion    Ankle eversion     (Blank rows = not tested)  LOWER EXTREMITY MMT:    MMT Right eval Left eval  Hip flexion 4 4-  Hip extension (seated manually resisted) 3 3+  Hip abduction 4- 4-  Hip adduction    Hip internal rotation    Hip external rotation    Knee flexion 5 4  Knee extension 5 5  Ankle dorsiflexion    Ankle plantarflexion    Ankle inversion    Ankle eversion     (Blank rows = not tested)  Reproduction of back pain when moving from one side lying position to supine to the other side lying position.   Low back pain with R S/L to sit transfer    LUMBAR SPECIAL TESTS:  (-) repeated flexion  test  FUNCTIONAL TESTS:    GAIT: Distance walked: 60 ft Assistive device utilized: None Level of assistance: Complete Independence Comments: antalgic, decreased stance L LE with R pelvic drop  TREATMENT DATE: 08/30/2024                                                                                                                               Neuromuscular re education Reclined transversus abdominis contraction 10x5 seconds  Improved exercise technique, movement at target joints, use of target muscles after mod verbal, visual, tactile cues.       PATIENT EDUCATION:  Education details: there-ex, HEP, POC Person educated: Patient Education method: Explanation, Demonstration, Verbal cues, and Handouts Education comprehension: verbalized understanding and returned demonstration  HOME EXERCISE PROGRAM: Access Code: [email or web address] URL: [email or web address] Date: 08/30/2024 Prepared by:  [facility]  Exercises - Supine Transversus Abdominis Bracing - Hands on Stomach  - 3 x daily - 7 x weekly - 3 sets - 10 reps - 5 seconds hold  ASSESSMENT:  CLINICAL IMPRESSION: Patient is a 68 y.o. male who was seen today for physical therapy evaluation and treatment for chronic R sided low back pain without sciatica. He also presents with altered gait pattern and posture, muscle tension R thoracolumbar paraspinal muscle area, decreased trunk and B hip strength, reproduction of symptoms with lumbar extension, L > R lateral flexion, and R lumbar rotation, and difficulty changing positions such as turning in bed, as well as getting up from the sitting or laying down position. Pt will benefit from skilled physical therapy services to address the aforementioned deficits.   OBJECTIVE IMPAIRMENTS: decreased strength, improper body mechanics, postural dysfunction, and pain.   ACTIVITY LIMITATIONS: bending, standing, transfers, and bed mobility  PARTICIPATION LIMITATIONS:   PERSONAL  FACTORS: Age, Fitness, Time since onset of injury/illness/exacerbation, and 3+ comorbidities: Depression, HTN, BPH, Elevated liver function tests are also affecting patient's functional outcome.   REHAB POTENTIAL: Fair    CLINICAL DECISION MAKING: Stable/uncomplicated  EVALUATION COMPLEXITY: Low   GOALS: Goals reviewed with patient? Yes  SHORT TERM GOALS: Target date: 09/14/2024  Pt will be independent with his initial HEP to decrease pain, improve strength, function, and ability to change positions more comfortably.  Baseline: Pt has started his initial HEP (08/30/2024). Goal status: INITIAL    LONG TERM GOALS: Target date: 10/26/2024  Pt will improve his B hip extension and abduction by at least 1/2 MMT grade to promote ability to change positions with less back discomfort.  Baseline:  MMT Right eval Left eval  Hip extension (seated manually resisted) 3 3+  Hip abduction 4- 4-   Goal status: INITIAL  2.  Pt will be able to transition from sitting <> standing as well as from supine <> standing without back pain to promote mobility.  Baseline: back pain reported with getting up from the sitting or lying down position (08/30/2024) Goal status: INITIAL  3.  Pt will be able to perform at least 3 floor <> stand transfers without back pain to promote mobility.  Baseline: increased back pain with the aforementioned transfer. Goal status: INITIAL   PLAN:  PT FREQUENCY: 1-2x/week  PT DURATION: 8 weeks  PLANNED INTERVENTIONS: 97110-Therapeutic exercises, 97530- Therapeutic activity, 97112- Neuromuscular re-education, 97535- Self Care, [id number]- Manual therapy, [phone]- Aquatic Therapy, [phone]- Electrical stimulation (unattended), [phone]- Traction (mechanical), [id number]- Ionotophoresis 4mg /ml Dexamethasone, Patient/Family education, Joint mobilization, and Spinal mobilization.  PLAN FOR NEXT SESSION: Posture, thoracolumbar mobility, trunk and glute strengthening, lumbopelvic  mechanics/control, manual techniques, modalities PRN.   Ephraim Reichel, PT, DPT 08/30/2024, 4:41 PM

## 2024-09-03 ENCOUNTER — Ambulatory Visit

## 2024-09-03 DIAGNOSIS — M5459 Other low back pain: Secondary | ICD-10-CM

## 2024-09-03 DIAGNOSIS — M546 Pain in thoracic spine: Secondary | ICD-10-CM

## 2024-09-03 NOTE — Therapy (Signed)
 OUTPATIENT PHYSICAL THERAPY THORACOLUMBAR TREATMENT   Patient Name: Eric Spears MRN: [id number] DOB:April 17, 1956, 68 y.o., male Today's Date: 09/03/2024  END OF SESSION:  PT End of Session - 09/03/24 0906     Visit Number 2    Number of Visits 17    Date for Recertification  10/26/24    PT Start Time 0908    PT Stop Time 0950    PT Time Calculation (min) 42 min    Activity Tolerance Patient tolerated treatment well    Behavior During Therapy [facility] for tasks assessed/performed          Past Medical History:  Diagnosis Date   Allergy    BPH (benign prostatic hyperplasia)    Depression    ED (erectile dysfunction)    Elevated liver function tests    GERD (gastroesophageal reflux disease)    Hypertension    Low HDL (under 40) 10/05   Past Surgical History:  Procedure Laterality Date   Finger trauma     NASAL SEPTUM SURGERY     Nose   Stress cardiolite  1998   Normal   Patient Active Problem List   Diagnosis Date Noted   Abrasion, leg w/ infection 08/22/2024   Abnormality of abdominal aorta 10/18/2023   Hyperlipidemia 11/01/2018   Hypertension 10/03/2018   Morbid obesity (HCC) 10/03/2018   Enlargement of abdominal aorta 12/26/2017   Fatty liver 10/19/2017   Elevated glucose level 10/18/2016   Somnolence, daytime 11/14/2014   Routine general medical examination at a health care facility 11/05/2014   Prostate cancer screening 11/05/2014   Colon cancer screening 07/03/2014   Low back pain 03/17/2011    PCP: [name], MD   REFERRING PROVIDER: [name], MD   REFERRING DIAG: Referring dx: chronic right-sided low back pain without sciatica  Rationale for Evaluation and Treatment: Rehabilitation  THERAPY DIAG:  Other low back pain  Pain in thoracic spine  ONSET DATE: 2-3 months ago, pt was working and picked up some limbs and twisted.   SUBJECTIVE:                                                                                                                                                                                            SUBJECTIVE STATEMENT: Low back pain   PERTINENT HISTORY:  Low back pain R side.  Pain started a few years ago. Pt sleeps on his R side. Back bothers him when he changes position such as getting up in the morning. Doctor thinks the pain is muscular. Has had low back pain since he was about 68 years old, pain  comes and goes.   No LE radiating symptoms.      Blood pressure is controlled per pt.    PAIN:  Are you having pain? Yes: NPRS scale: 0/10 currently Pain location: R thoracolumbar paraspinal area and R iliac crest.  Pain description: sharp, radiating, electrical  Aggravating factors: changing positions such as getting up from the floor. Getting down to the ground and getting up from the ground. Getting hit from the side. Pain bothers him first thing in the morning. Relieving factors: sleeping on a recliner; supine with feet propped on pillows.  Reclined position  PRECAUTIONS: no known precautions.   RED FLAGS: Bowel or bladder incontinence: No and Cauda equina syndrome: Yes: when sitting on a toilet or something hard; rarely ever happens.     WEIGHT BEARING RESTRICTIONS: No  FALLS:  Has patient fallen in last 6 months? No  LIVING ENVIRONMENT: Lives with: lives alone Lives in: House/apartment Stairs: Yes: External: 4 steps; on left going up Has following equipment at home: None  OCCUPATION: retired  PLOF: Independent  PATIENT GOALS: make the pain Tevion Laforge away or find out what it is.   NEXT MD VISIT: later on this year  OBJECTIVE:  Note: Objective measures were completed at Evaluation unless otherwise noted.  DIAGNOSTIC FINDINGS:    PATIENT SURVEYS:  Modified Oswestry:  MODIFIED OSWESTRY DISABILITY SCALE  Date: 08/30/2024 Score  Pain intensity 0 = I can tolerate the pain I have without having to use pain medication.  2. Personal care (washing, dressing, etc.) 0 =  I can take  care of myself normally without causing increased pain.  3. Lifting 0 = I can lift heavy weights without increased pain.  4. Walking 0 = Pain does not prevent me from walking any distance  5. Sitting 0 =  I can sit in any chair as long as I like.  6. Standing 0 =  I can stand as long as I want without increased pain.  7. Sleeping 0 = Pain does not prevent me from sleeping well.  8. Social Life 0 = My social life is normal and does not increase my pain.  9. Traveling 0 =  I can travel anywhere without increased pain.  10. Employment/ Homemaking 1 = My normal homemaking/job activities increase my pain, but I can still perform all that is required of me  Total 1/50 (2%)   Interpretation of scores: Score Category Description  0-20% Minimal Disability The patient can cope with most living activities. Usually no treatment is indicated apart from advice on lifting, sitting and exercise  21-40% Moderate Disability The patient experiences more pain and difficulty with sitting, lifting and standing. Travel and social life are more difficult and they may be disabled from work. Personal care, sexual activity and sleeping are not grossly affected, and the patient can usually be managed by conservative means  41-60% Severe Disability Pain remains the main problem in this group, but activities of daily living are affected. These patients require a detailed investigation  61-80% Crippled Back pain impinges on all aspects of the patient's life. Positive intervention is required  81-100% Bed-bound These patients are either bed-bound or exaggerating their symptoms  [name], et al. Surgery versus conservative management of stable thoracolumbar fracture: the PRESTO feasibility RCT. [place] ([place]): [employer]; 2021 Nov. [facility] Technology Assessment, No. 25.62.) Appendix 3, Oswestry Disability Index category descriptors. Available from:  [email or web address].cz  Minimally Clinically Important Difference (MCID) = 12.8%  COGNITION:  Overall cognitive status: Within functional limits for tasks assessed     SENSATION:   MUSCLE LENGTH:   POSTURE: B protracted shoulders, R scapular protraction > L, thoracic kyphosis, movement preference around L3/4 area with R trunk rotation, B genu valgus and B foot pronation.   PALPATION: Muscle tension R thoracolumbar area.   LUMBAR ROM:   AROM eval  Flexion Limited, slight aberrant movement/pt about to walk up with hands when returning to neutral.   Extension Limited with thoracolumbar spine tightness similar to area of pain  Right lateral flexion Limited with R iliac crest area tightness, focused around L5/S1 area  Left lateral flexion Limited with R iliac crest area tightness  Right rotation Limited with R thoracolumbar paraspinal tightness  Left rotation Limited with L posterior trunk tightness.    (Blank rows = not tested)  LOWER EXTREMITY ROM:     Passive  Right eval Left eval  Hip flexion    Hip extension    Hip abduction    Hip adduction    Hip internal rotation    Hip external rotation    Knee flexion    Knee extension    Ankle dorsiflexion    Ankle plantarflexion    Ankle inversion    Ankle eversion     (Blank rows = not tested)  LOWER EXTREMITY MMT:    MMT Right eval Left eval  Hip flexion 4 4-  Hip extension (seated manually resisted) 3 3+  Hip abduction 4- 4-  Hip adduction    Hip internal rotation    Hip external rotation    Knee flexion 5 4  Knee extension 5 5  Ankle dorsiflexion    Ankle plantarflexion    Ankle inversion    Ankle eversion     (Blank rows = not tested)  Reproduction of back pain when moving from one side lying position to supine to the other side lying position.   Low back pain with R S/L to sit transfer    LUMBAR SPECIAL TESTS:  (-) repeated flexion test  FUNCTIONAL TESTS:     GAIT: Distance walked: 60 ft Assistive device utilized: None Level of assistance: Complete Independence Comments: antalgic, decreased stance L LE with R pelvic drop  TREATMENT DATE: 09/03/24                                                                                                                               Subjective: Patient reports that his lower back feels good today. Still hurts with transitions such as bed transfers, weight shifting, sit to stand.   Therapeutic Exercise (focused on hip core strengthening/stabilization):    Seated Pallof Press 3 x 12 - Blue TB    Supine Posterior Pelvic Tilt    1 x 10 x 5s    McGill Curl Up   3 x 5 reps x 10s     Hooklying Med Bed [employer] for core stabilization 3  x 10 - 3 Kg MB, with pelvic tilt   Pt education on proper TA bracing without valsalva maneuver. Good return demonstration with TA activation with pelvic tilting    Therapeutic Activity (with intent to strengthen core and gluteal muscles during functional activities):    Standing Lateral Trunk Flexion against resistance    3 x 10 - 10# KB in L hand   Lateral Stepping Against resistance   4 x 12 ft - BTB around knees   4 x 12 ft - BTB Around ankles   4 x 12 ft - BTB Around ankles  PATIENT EDUCATION:  Education details: there-ex, HEP, POC Person educated: Patient Education method: Explanation, Demonstration, Verbal cues, and Handouts Education comprehension: verbalized understanding and returned demonstration  HOME EXERCISE PROGRAM: Access Code: [email or web address] URL: [email or web address] Date: 09/03/2024 Prepared by: Lonni Pall  Exercises - Supine Transversus Abdominis Bracing - Hands on Stomach  - 3 x daily - 7 x weekly - 3 sets - 10 reps - 5 seconds hold - Supine Posterior Pelvic Tilt  - 1 x daily - 3-4 x weekly - 2-3 sets - 10-12 reps - 5 hold - Neutral Curl Up with Straight Leg  - 1 x daily - 3-4 x weekly - 2-3 sets - 10-12 reps - 10s  hold  Access Code: [email or web address] URL: [email or web address] Date: 08/30/2024 Prepared by: Emil Glassman  Exercises - Supine Transversus Abdominis Bracing - Hands on Stomach  - 3 x daily - 7 x weekly - 3 sets - 10 reps - 5 seconds hold  ASSESSMENT:  CLINICAL IMPRESSION: Patient returned to OPPT for first follow up in management of lower back pain. PT focused on core stabilization. Patient cued on proper TA activation with diaphragmatic breathing. Verbal cues provided for pelvic tilt in order to reduce lumbar extension. Pt tolerated all exercises without exacerbation of pain in the lumbar spine. Pt will benefit from skilled PT services to address current deficits and maximize return to PLOF.     OBJECTIVE IMPAIRMENTS: decreased strength, improper body mechanics, postural dysfunction, and pain.   ACTIVITY LIMITATIONS: bending, standing, transfers, and bed mobility  PARTICIPATION LIMITATIONS:   PERSONAL FACTORS: Age, Fitness, Time since onset of injury/illness/exacerbation, and 3+ comorbidities: Depression, HTN, BPH, Elevated liver function tests are also affecting patient's functional outcome.   REHAB POTENTIAL: Fair    CLINICAL DECISION MAKING: Stable/uncomplicated  EVALUATION COMPLEXITY: Low   GOALS: Goals reviewed with patient? Yes  SHORT TERM GOALS: Target date: 09/14/2024  Pt will be independent with his initial HEP to decrease pain, improve strength, function, and ability to change positions more comfortably.  Baseline: Pt has started his initial HEP (08/30/2024). Goal status: INITIAL    LONG TERM GOALS: Target date: 10/26/2024  Pt will improve his B hip extension and abduction by at least 1/2 MMT grade to promote ability to change positions with less back discomfort.  Baseline:  MMT Right eval Left eval  Hip extension (seated manually resisted) 3 3+  Hip abduction 4- 4-   Goal status: INITIAL  2.  Pt will be able to transition from sitting <> standing  as well as from supine <> standing without back pain to promote mobility.  Baseline: back pain reported with getting up from the sitting or lying down position (08/30/2024) Goal status: INITIAL  3.  Pt will be able to perform at least 3 floor <> stand transfers without back pain to promote mobility.  Baseline: increased back pain with the aforementioned transfer. Goal status: INITIAL  PLAN:  PT FREQUENCY: 1-2x/week  PT DURATION: 8 weeks  PLANNED INTERVENTIONS: 97110-Therapeutic exercises, 97530- Therapeutic activity, 97112- Neuromuscular re-education, 97535- Self Care, 02859- Manual therapy, [phone]- Aquatic Therapy, [phone]- Electrical stimulation (unattended), [phone]- Traction (mechanical), [id number]- Ionotophoresis 4mg /ml Dexamethasone, Patient/Family education, Joint mobilization, and Spinal mobilization.  PLAN FOR NEXT SESSION: Posture, thoracolumbar mobility, trunk and glute strengthening, lumbopelvic mechanics/control, manual techniques, modalities PRN.   Lonni Pall PT, DPT Physical Therapist- [place]  09/03/2024, 11:02 AM

## 2024-09-06 ENCOUNTER — Ambulatory Visit

## 2024-09-06 DIAGNOSIS — M5459 Other low back pain: Secondary | ICD-10-CM

## 2024-09-06 DIAGNOSIS — M546 Pain in thoracic spine: Secondary | ICD-10-CM

## 2024-09-06 NOTE — Therapy (Signed)
 OUTPATIENT PHYSICAL THERAPY THORACOLUMBAR TREATMENT   Patient Name: Eric Spears MRN: [id number] DOB:July 26, 1956, 68 y.o., male Today's Date: 09/06/2024  END OF SESSION:  PT End of Session - 09/06/24 0902     Visit Number 3    Number of Visits 17    Date for Recertification  10/26/24    PT Start Time 0902    PT Stop Time 0945    PT Time Calculation (min) 43 min    Activity Tolerance Patient tolerated treatment well    Behavior During Therapy [facility] for tasks assessed/performed          Past Medical History:  Diagnosis Date   Allergy    BPH (benign prostatic hyperplasia)    Depression    ED (erectile dysfunction)    Elevated liver function tests    GERD (gastroesophageal reflux disease)    Hypertension    Low HDL (under 40) 10/05   Past Surgical History:  Procedure Laterality Date   Finger trauma     NASAL SEPTUM SURGERY     Nose   Stress cardiolite  1998   Normal   Patient Active Problem List   Diagnosis Date Noted   Abrasion, leg w/ infection 08/22/2024   Abnormality of abdominal aorta 10/18/2023   Hyperlipidemia 11/01/2018   Hypertension 10/03/2018   Morbid obesity (HCC) 10/03/2018   Enlargement of abdominal aorta 12/26/2017   Fatty liver 10/19/2017   Elevated glucose level 10/18/2016   Somnolence, daytime 11/14/2014   Routine general medical examination at a health care facility 11/05/2014   Prostate cancer screening 11/05/2014   Colon cancer screening 07/03/2014   Low back pain 03/17/2011    PCP: Eric Laine LABOR, MD   REFERRING PROVIDER: [name], MD   REFERRING DIAG: Referring dx: chronic right-sided low back pain without sciatica  Rationale for Evaluation and Treatment: Rehabilitation  THERAPY DIAG:  No diagnosis found.  ONSET DATE: 2-3 months ago, pt was working and picked up some limbs and twisted.   SUBJECTIVE:                                                                                                                                                                                            SUBJECTIVE STATEMENT: Low back pain   PERTINENT HISTORY:  Low back pain R side.  Pain started a few years ago. Pt sleeps on his R side. Back bothers him when he changes position such as getting up in the morning. Doctor thinks the pain is muscular. Has had low back pain since he was about 68 years old, pain comes and goes.   No  LE radiating symptoms.      Blood pressure is controlled per pt.    PAIN:  Are you having pain? Yes: NPRS scale: 0/10 currently Pain location: R thoracolumbar paraspinal area and R iliac crest.  Pain description: sharp, radiating, electrical  Aggravating factors: changing positions such as getting up from the floor. Getting down to the ground and getting up from the ground. Getting hit from the side. Pain bothers him first thing in the morning. Relieving factors: sleeping on a recliner; supine with feet propped on pillows.  Reclined position  PRECAUTIONS: no known precautions.   RED FLAGS: Bowel or bladder incontinence: No and Cauda equina syndrome: Yes: when sitting on a toilet or something hard; rarely ever happens.     WEIGHT BEARING RESTRICTIONS: No  FALLS:  Has patient fallen in last 6 months? No  LIVING ENVIRONMENT: Lives with: lives alone Lives in: House/apartment Stairs: Yes: External: 4 steps; on left going up Has following equipment at home: None  OCCUPATION: retired  PLOF: Independent  PATIENT GOALS: make the pain Eric Spears away or find out what it is.   NEXT MD VISIT: later on this year  OBJECTIVE:  Note: Objective measures were completed at Evaluation unless otherwise noted.  DIAGNOSTIC FINDINGS:    PATIENT SURVEYS:  Modified Oswestry:  MODIFIED OSWESTRY DISABILITY SCALE  Date: 08/30/2024 Score  Pain intensity 0 = I can tolerate the pain I have without having to use pain medication.  2. Personal care (washing, dressing, etc.) 0 =  I can take care of myself normally  without causing increased pain.  3. Lifting 0 = I can lift heavy weights without increased pain.  4. Walking 0 = Pain does not prevent me from walking any distance  5. Sitting 0 =  I can sit in any chair as long as I like.  6. Standing 0 =  I can stand as long as I want without increased pain.  7. Sleeping 0 = Pain does not prevent me from sleeping well.  8. Social Life 0 = My social life is normal and does not increase my pain.  9. Traveling 0 =  I can travel anywhere without increased pain.  10. Employment/ Homemaking 1 = My normal homemaking/job activities increase my pain, but I can still perform all that is required of me  Total 1/50 (2%)   Interpretation of scores: Score Category Description  0-20% Minimal Disability The patient can cope with most living activities. Usually no treatment is indicated apart from advice on lifting, sitting and exercise  21-40% Moderate Disability The patient experiences more pain and difficulty with sitting, lifting and standing. Travel and social life are more difficult and they may be disabled from work. Personal care, sexual activity and sleeping are not grossly affected, and the patient can usually be managed by conservative means  41-60% Severe Disability Pain remains the main problem in this group, but activities of daily living are affected. These patients require a detailed investigation  61-80% Crippled Back pain impinges on all aspects of the patient's life. Positive intervention is required  81-100% Bed-bound These patients are either bed-bound or exaggerating their symptoms  Eric Spears Eric Spears Eric Spears, et al. Surgery versus conservative management of stable thoracolumbar fracture: the PRESTO feasibility RCT. [place] ([place]): [employer]; 2021 Nov. [facility] Technology Assessment, No. 25.62.) Appendix 3, Oswestry Disability Index category descriptors. Available from: [email or web address].cz  Minimally Clinically  Important Difference (MCID) = 12.8%  COGNITION: Overall cognitive status: Within functional limits  for tasks assessed     SENSATION:   MUSCLE LENGTH:   POSTURE: B protracted shoulders, R scapular protraction > L, thoracic kyphosis, movement preference around L3/4 area with R trunk rotation, B genu valgus and B foot pronation.   PALPATION: Muscle tension R thoracolumbar area.   LUMBAR ROM:   AROM eval  Flexion Limited, slight aberrant movement/pt about to walk up with hands when returning to neutral.   Extension Limited with thoracolumbar spine tightness similar to area of pain  Right lateral flexion Limited with R iliac crest area tightness, focused around L5/S1 area  Left lateral flexion Limited with R iliac crest area tightness  Right rotation Limited with R thoracolumbar paraspinal tightness  Left rotation Limited with L posterior trunk tightness.    (Blank rows = not tested)  LOWER EXTREMITY ROM:     Passive  Right eval Left eval  Hip flexion    Hip extension    Hip abduction    Hip adduction    Hip internal rotation    Hip external rotation    Knee flexion    Knee extension    Ankle dorsiflexion    Ankle plantarflexion    Ankle inversion    Ankle eversion     (Blank rows = not tested)  LOWER EXTREMITY MMT:    MMT Right eval Left eval  Hip flexion 4 4-  Hip extension (seated manually resisted) 3 3+  Hip abduction 4- 4-  Hip adduction    Hip internal rotation    Hip external rotation    Knee flexion 5 4  Knee extension 5 5  Ankle dorsiflexion    Ankle plantarflexion    Ankle inversion    Ankle eversion     (Blank rows = not tested)  Reproduction of back pain when moving from one side lying position to supine to the other side lying position.   Low back pain with R S/L to sit transfer    LUMBAR SPECIAL TESTS:  (-) repeated flexion test  FUNCTIONAL TESTS:    GAIT: Distance walked: 60 ft Assistive device utilized: None Level of  assistance: Complete Independence Comments: antalgic, decreased stance L LE with R pelvic drop  TREATMENT DATE: 09/06/24                                                                                                                               Subjective: Patient reported that he has improved mind muscle connection with core stabilization exercises. No pain at start of session.    Therapeutic Exercise (focused on hip core strengthening/stabilization):    Supine Posterior Pelvic Tilt with TA Activation    1 x 10 x 5s    Supine TA Activation with alternating LE marching    2 x 20 reps - slow tempo    Supine TA activation with Single Hip flexion into knee extension.    3 x 10 reps  [place] Med Ball Reach for core stabilization    3 x 10 - 3 Kg MB    Supine Bridge with TA activation    2 x 10 - minor pain and unable to maintain TA brace with higher ranges of hip ext  Therapeutic Activity (with intent to strengthen core and gluteal muscles during functional activities):   NuStep (seat 10) x 6 min x Level 6-2 UE/LE for improved LE strength, endurance and core strength. PT manually adjusted resistance.    Standing Lateral Trunk Flexion against resistance    1 x 10 - 10# KB in L hand   1 x 10 - 15# DB in L hand    1 x 10 - 20# KB in L Hand   Lateral Stepping Against resistance   4 x 12 ft - Blue TB Around ankles   4 x 12 ft - Blue TB Around ankles   4 x 12 ft - Blue TB Around ankles   PATIENT EDUCATION:  Education details: there-ex, HEP, POC Person educated: Patient Education method: Explanation, Demonstration, Verbal cues, and Handouts Education comprehension: verbalized understanding and returned demonstration  HOME EXERCISE PROGRAM: Access Code: [email or web address] URL: [email or web address] Date: 09/03/2024 Prepared by: Lonni Pall  Exercises - Supine Transversus Abdominis Bracing - Hands on Stomach  - 3 x daily - 7 x weekly - 3 sets - 10 reps - 5  seconds hold - Supine Posterior Pelvic Tilt  - 1 x daily - 3-4 x weekly - 2-3 sets - 10-12 reps - 5 hold - Neutral Curl Up with Straight Leg  - 1 x daily - 3-4 x weekly - 2-3 sets - 10-12 reps - 10s hold  Access Code: [email or web address] URL: [email or web address] Date: 08/30/2024 Prepared by: Emil Glassman  Exercises - Supine Transversus Abdominis Bracing - Hands on Stomach  - 3 x daily - 7 x weekly - 3 sets - 10 reps - 5 seconds hold  ASSESSMENT:  CLINICAL IMPRESSION: Continued PT POC focused on LBP. Patient's pain more manageable with transitions during bed mobility and sit to stand transfers following last appt. He still requiries VC in order to maintain TA brace. Pt tolerated all exercises without exacerbation of pain in the lumbar spine. Provided patient with TB in order to perform lateral stepping against resistance; good return demonstration without major LOB. Pt will benefit from skilled PT services to address current deficits and maximize return to PLOF.    OBJECTIVE IMPAIRMENTS: decreased strength, improper body mechanics, postural dysfunction, and pain.   ACTIVITY LIMITATIONS: bending, standing, transfers, and bed mobility  PARTICIPATION LIMITATIONS:   PERSONAL FACTORS: Age, Fitness, Time since onset of injury/illness/exacerbation, and 3+ comorbidities: Depression, HTN, BPH, Elevated liver function tests are also affecting patient's functional outcome.   REHAB POTENTIAL: Fair    CLINICAL DECISION MAKING: Stable/uncomplicated  EVALUATION COMPLEXITY: Low   GOALS: Goals reviewed with patient? Yes  SHORT TERM GOALS: Target date: 09/14/2024  Pt will be independent with his initial HEP to decrease pain, improve strength, function, and ability to change positions more comfortably.  Baseline: Pt has started his initial HEP (08/30/2024). Goal status: INITIAL    LONG TERM GOALS: Target date: 10/26/2024  Pt will improve his B hip extension and abduction by at least 1/2  MMT grade to promote ability to change positions with less back discomfort.  Baseline:  MMT Right eval Left eval  Hip extension (seated manually resisted) 3 3+  Hip abduction 4- 4-   Goal status: INITIAL  2.  Pt will be able to transition from sitting <> standing as well as from supine <> standing without back pain to promote mobility.  Baseline: back pain reported with getting up from the sitting or lying down position (08/30/2024) Goal status: INITIAL  3.  Pt will be able to perform at least 3 floor <> stand transfers without back pain to promote mobility.  Baseline: increased back pain with the aforementioned transfer. Goal status: INITIAL   PLAN:  PT FREQUENCY: 1-2x/week  PT DURATION: 8 weeks  PLANNED INTERVENTIONS: 97110-Therapeutic exercises, 97530- Therapeutic activity, [id number]- Neuromuscular re-education, 97535- Self Care, [id number]- Manual therapy, [phone]- Aquatic Therapy, [phone]- Electrical stimulation (unattended), [phone]- Traction (mechanical), [id number]- Ionotophoresis 4mg /ml Dexamethasone, Patient/Family education, Joint mobilization, and Spinal mobilization.  PLAN FOR NEXT SESSION: Posture, thoracolumbar mobility, trunk and glute strengthening, lumbopelvic mechanics/control, manual techniques, modalities PRN.   Lonni Pall PT, DPT Physical Therapist- [place]  09/06/2024, 9:02 AM

## 2024-09-11 ENCOUNTER — Ambulatory Visit: Attending: Family Medicine

## 2024-09-11 DIAGNOSIS — M546 Pain in thoracic spine: Secondary | ICD-10-CM | POA: Diagnosis present

## 2024-09-11 DIAGNOSIS — M5459 Other low back pain: Secondary | ICD-10-CM | POA: Insufficient documentation

## 2024-09-11 NOTE — Therapy (Signed)
 OUTPATIENT PHYSICAL THERAPY THORACOLUMBAR TREATMENT   Patient Name: Eric Spears MRN: [id number] DOB:[birth year], 68 y.o., male Today's Date: 09/11/2024  END OF SESSION:  PT End of Session - 09/11/24 1040     Visit Number 4    Number of Visits 17    Date for Recertification  10/26/24    PT Start Time 1037    PT Stop Time 1120    PT Time Calculation (min) 43 min    Activity Tolerance Patient tolerated treatment well    Behavior During Therapy [facility] for tasks assessed/performed          Past Medical History:  Diagnosis Date   Allergy    BPH (benign prostatic hyperplasia)    Depression    ED (erectile dysfunction)    Elevated liver function tests    GERD (gastroesophageal reflux disease)    Hypertension    Low HDL (under 40) 10/05   Past Surgical History:  Procedure Laterality Date   Finger trauma     NASAL SEPTUM SURGERY     Nose   Stress cardiolite  1998   Normal   Patient Active Problem List   Diagnosis Date Noted   Abrasion, leg w/ infection 08/22/2024   Abnormality of abdominal aorta 10/18/2023   Hyperlipidemia 11/01/2018   Hypertension 10/03/2018   Morbid obesity (HCC) 10/03/2018   Enlargement of abdominal aorta 12/26/2017   Fatty liver 10/19/2017   Elevated glucose level 10/18/2016   Somnolence, daytime 11/14/2014   Routine general medical examination at a health care facility 11/05/2014   Prostate cancer screening 11/05/2014   Colon cancer screening 07/03/2014   Low back pain 03/17/2011    PCP: [name], MD   REFERRING PROVIDER: [name], MD   REFERRING DIAG: Referring dx: chronic right-sided low back pain without sciatica  Rationale for Evaluation and Treatment: Rehabilitation  THERAPY DIAG:  Other low back pain  Pain in thoracic spine  ONSET DATE: 2-3 months ago, pt was working and picked up some limbs and twisted.   SUBJECTIVE:                                                                                                                                                                                            SUBJECTIVE STATEMENT: Low back pain   PERTINENT HISTORY:  Low back pain R side.  Pain started a few years ago. Pt sleeps on his R side. Back bothers him when he changes position such as getting up in the morning. Doctor thinks the pain is muscular. Has had low back pain since he was about 68 years old, pain  comes and goes.   No LE radiating symptoms.      Blood pressure is controlled per pt.    PAIN:  Are you having pain? Yes: NPRS scale: 0/10 currently Pain location: R thoracolumbar paraspinal area and R iliac crest.  Pain description: sharp, radiating, electrical  Aggravating factors: changing positions such as getting up from the floor. Getting down to the ground and getting up from the ground. Getting hit from the side. Pain bothers him first thing in the morning. Relieving factors: sleeping on a recliner; supine with feet propped on pillows.  Reclined position  PRECAUTIONS: no known precautions.   RED FLAGS: Bowel or bladder incontinence: No and Cauda equina syndrome: Yes: when sitting on a toilet or something hard; rarely ever happens.     WEIGHT BEARING RESTRICTIONS: No  FALLS:  Has patient fallen in last 6 months? No  LIVING ENVIRONMENT: Lives with: lives alone Lives in: House/apartment Stairs: Yes: External: 4 steps; on left going up Has following equipment at home: None  OCCUPATION: retired  PLOF: Independent  PATIENT GOALS: make the pain Tyqwan Pink away or find out what it is.   NEXT MD VISIT: later on this year  OBJECTIVE:  Note: Objective measures were completed at Evaluation unless otherwise noted.  DIAGNOSTIC FINDINGS:    PATIENT SURVEYS:  Modified Oswestry:  MODIFIED OSWESTRY DISABILITY SCALE  Date: 08/30/2024 Score  Pain intensity 0 = I can tolerate the pain I have without having to use pain medication.  2. Personal care (washing, dressing, etc.) 0 =  I can take  care of myself normally without causing increased pain.  3. Lifting 0 = I can lift heavy weights without increased pain.  4. Walking 0 = Pain does not prevent me from walking any distance  5. Sitting 0 =  I can sit in any chair as long as I like.  6. Standing 0 =  I can stand as long as I want without increased pain.  7. Sleeping 0 = Pain does not prevent me from sleeping well.  8. Social Life 0 = My social life is normal and does not increase my pain.  9. Traveling 0 =  I can travel anywhere without increased pain.  10. Employment/ Homemaking 1 = My normal homemaking/job activities increase my pain, but I can still perform all that is required of me  Total 1/50 (2%)   Interpretation of scores: Score Category Description  0-20% Minimal Disability The patient can cope with most living activities. Usually no treatment is indicated apart from advice on lifting, sitting and exercise  21-40% Moderate Disability The patient experiences more pain and difficulty with sitting, lifting and standing. Travel and social life are more difficult and they may be disabled from work. Personal care, sexual activity and sleeping are not grossly affected, and the patient can usually be managed by conservative means  41-60% Severe Disability Pain remains the main problem in this group, but activities of daily living are affected. These patients require a detailed investigation  61-80% Crippled Back pain impinges on all aspects of the patient's life. Positive intervention is required  81-100% Bed-bound These patients are either bed-bound or exaggerating their symptoms  [name], et al. Surgery versus conservative management of stable thoracolumbar fracture: the PRESTO feasibility RCT. [place] ([place]): [employer]; 2021 Nov. [facility] Technology Assessment, No. 25.62.) Appendix 3, Oswestry Disability Index category descriptors. Available from:  [email or web address].cz  Minimally Clinically Important Difference (MCID) = 12.8%  COGNITION:  Overall cognitive status: Within functional limits for tasks assessed     SENSATION:   MUSCLE LENGTH:   POSTURE: B protracted shoulders, R scapular protraction > L, thoracic kyphosis, movement preference around L3/4 area with R trunk rotation, B genu valgus and B foot pronation.   PALPATION: Muscle tension R thoracolumbar area.   LUMBAR ROM:   AROM eval  Flexion Limited, slight aberrant movement/pt about to walk up with hands when returning to neutral.   Extension Limited with thoracolumbar spine tightness similar to area of pain  Right lateral flexion Limited with R iliac crest area tightness, focused around L5/S1 area  Left lateral flexion Limited with R iliac crest area tightness  Right rotation Limited with R thoracolumbar paraspinal tightness  Left rotation Limited with L posterior trunk tightness.    (Blank rows = not tested)  LOWER EXTREMITY ROM:     Passive  Right eval Left eval  Hip flexion    Hip extension    Hip abduction    Hip adduction    Hip internal rotation    Hip external rotation    Knee flexion    Knee extension    Ankle dorsiflexion    Ankle plantarflexion    Ankle inversion    Ankle eversion     (Blank rows = not tested)  LOWER EXTREMITY MMT:    MMT Right eval Left eval  Hip flexion 4 4-  Hip extension (seated manually resisted) 3 3+  Hip abduction 4- 4-  Hip adduction    Hip internal rotation    Hip external rotation    Knee flexion 5 4  Knee extension 5 5  Ankle dorsiflexion    Ankle plantarflexion    Ankle inversion    Ankle eversion     (Blank rows = not tested)  Reproduction of back pain when moving from one side lying position to supine to the other side lying position.   Low back pain with R S/L to sit transfer    LUMBAR SPECIAL TESTS:  (-) repeated flexion test  FUNCTIONAL TESTS:     GAIT: Distance walked: 60 ft Assistive device utilized: None Level of assistance: Complete Independence Comments: antalgic, decreased stance L LE with R pelvic drop  TREATMENT DATE: 09/11/24                                                                                                                               Subjective: Patient reported that he was able to sleep in his bed rather than the recliner however onset of pain with bed mobiltiy (5-6/10). Took muscle relaxer in order to mitigate the pain. No questions or concerns.   Therapeutic Exercise (focused on hip core strengthening/stabilization):     Hooklying Alternating LE march with Pallof Press    3 x 20 marches - Grey TB      [place]ing [place] Med Creswell Reach for core stabilization    1 x 10 -  8# weighted dowel    2 x 10 - 7# DB    Supine Bridge with TA activation   2 x 10    Posterior Pelvic Tilt   1 x 15 x 2s hold    Hooklying Lumbar Rotation for improved lumbar mobility     R/L: 10 ea direction   Seated Forward Lumbar Flexion Rollout  1 x 10 fwd with white physioball    Therapeutic Activity (with intent to strengthen core and gluteal muscles during functional activities):   NuStep (seat 11) x 6 min x Level 6-2 UE/LE for improved LE strength, endurance and core strength. PT manually adjusted resistance.    Standing Horizontal Rotation against resistance  R/L resistance - 2 x 12 against GTB   PT education on proper bed mobility technique  - Focused on log rolling <> sideling <> Sitting   - emphasis on using BLE in order to help transition in to seated position.    PATIENT EDUCATION:  Education details: there-ex, HEP, POC Person educated: Patient Education method: Explanation, Demonstration, Verbal cues, and Handouts Education comprehension: verbalized understanding and returned demonstration  HOME EXERCISE PROGRAM: Access Code: [email or web address] URL: [email or web address] Date: 09/03/2024 Prepared  by: Lonni Pall  Exercises - Supine Transversus Abdominis Bracing - Hands on Stomach  - 3 x daily - 7 x weekly - 3 sets - 10 reps - 5 seconds hold - Supine Posterior Pelvic Tilt  - 1 x daily - 3-4 x weekly - 2-3 sets - 10-12 reps - 5 hold - Neutral Curl Up with Straight Leg  - 1 x daily - 3-4 x weekly - 2-3 sets - 10-12 reps - 10s hold  Access Code: [email or web address] URL: [email or web address] Date: 08/30/2024 Prepared by: Emil Glassman  Exercises - Supine Transversus Abdominis Bracing - Hands on Stomach  - 3 x daily - 7 x weekly - 3 sets - 10 reps - 5 seconds hold  ASSESSMENT:  CLINICAL IMPRESSION: Continued PT POC focused on management of LBP. Patient's pain with increased soreness in today's session. PT reduced intensity of core exercises due to visible discomfort in lumbar spine. Pain improved with TA activation in supine and hooklying positions. He tolerated standing rotations against resistance. PT to focus on progressing core stabilization exercises in standing positions. PT education on proper bed mobility technique in order to improve transitional pain during bed mobility. Based on today's performance the patient will benefit from skilled PT services in order to improve lower back pain and maximize return to PLOF.    OBJECTIVE IMPAIRMENTS: decreased strength, improper body mechanics, postural dysfunction, and pain.   ACTIVITY LIMITATIONS: bending, standing, transfers, and bed mobility  PARTICIPATION LIMITATIONS:   PERSONAL FACTORS: Age, Fitness, Time since onset of injury/illness/exacerbation, and 3+ comorbidities: Depression, HTN, BPH, Elevated liver function tests are also affecting patient's functional outcome.   REHAB POTENTIAL: Fair    CLINICAL DECISION MAKING: Stable/uncomplicated  EVALUATION COMPLEXITY: Low   GOALS: Goals reviewed with patient? Yes  SHORT TERM GOALS: Target date: 09/14/2024  Pt will be independent with his initial HEP to decrease pain,  improve strength, function, and ability to change positions more comfortably.  Baseline: Pt has started his initial HEP (08/30/2024). Goal status: INITIAL    LONG TERM GOALS: Target date: 10/26/2024  Pt will improve his B hip extension and abduction by at least 1/2 MMT grade to promote ability to change positions with less back discomfort.  Baseline:  MMT Right eval Left eval  Hip extension (seated manually resisted) 3  3+  Hip abduction 4- 4-   Goal status: INITIAL  2.  Pt will be able to transition from sitting <> standing as well as from supine <> standing without back pain to promote mobility.  Baseline: back pain reported with getting up from the sitting or lying down position (08/30/2024) Goal status: INITIAL  3.  Pt will be able to perform at least 3 floor <> stand transfers without back pain to promote mobility.  Baseline: increased back pain with the aforementioned transfer. Goal status: INITIAL   PLAN:  PT FREQUENCY: 1-2x/week  PT DURATION: 8 weeks  PLANNED INTERVENTIONS: 97110-Therapeutic exercises, 97530- Therapeutic activity, 97112- Neuromuscular re-education, 97535- Self Care, [id number]- Manual therapy, [phone]- Aquatic Therapy, [phone]- Electrical stimulation (unattended), [phone]- Traction (mechanical), [id number]- Ionotophoresis 4mg /ml Dexamethasone, Patient/Family education, Joint mobilization, and Spinal mobilization.  PLAN FOR NEXT SESSION: Posture, thoracolumbar mobility, trunk and glute strengthening, lumbopelvic mechanics/control, manual techniques, modalities PRN.   Lonni Pall PT, DPT Physical Therapist- [place]  09/11/2024, 12:12 PM

## 2024-09-18 ENCOUNTER — Ambulatory Visit

## 2024-09-18 DIAGNOSIS — M546 Pain in thoracic spine: Secondary | ICD-10-CM

## 2024-09-18 DIAGNOSIS — M5459 Other low back pain: Secondary | ICD-10-CM | POA: Diagnosis not present

## 2024-09-18 NOTE — Therapy (Signed)
 OUTPATIENT PHYSICAL THERAPY THORACOLUMBAR TREATMENT   Patient Name: Eric Spears MRN: [id number] DOB:10/05/1956, 68 y.o., male Today's Date: 09/18/2024  END OF SESSION:  PT End of Session - 09/18/24 1347     Visit Number 5    Number of Visits 17    Date for Recertification  10/26/24    PT Start Time 1346    PT Stop Time 1425    PT Time Calculation (min) 39 min    Activity Tolerance Patient tolerated treatment well    Behavior During Therapy [facility] for tasks assessed/performed           Past Medical History:  Diagnosis Date   Allergy    BPH (benign prostatic hyperplasia)    Depression    ED (erectile dysfunction)    Elevated liver function tests    GERD (gastroesophageal reflux disease)    Hypertension    Low HDL (under 40) 10/05   Past Surgical History:  Procedure Laterality Date   Finger trauma     NASAL SEPTUM SURGERY     Nose   Stress cardiolite  1998   Normal   Patient Active Problem List   Diagnosis Date Noted   Abrasion, leg w/ infection 08/22/2024   Abnormality of abdominal aorta 10/18/2023   Hyperlipidemia 11/01/2018   Hypertension 10/03/2018   Morbid obesity (HCC) 10/03/2018   Enlargement of abdominal aorta 12/26/2017   Fatty liver 10/19/2017   Elevated glucose level 10/18/2016   Somnolence, daytime 11/14/2014   Routine general medical examination at a health care facility 11/05/2014   Prostate cancer screening 11/05/2014   Colon cancer screening 07/03/2014   Low back pain 03/17/2011    PCP: [name], MD   REFERRING PROVIDER: [name], MD   REFERRING DIAG: Referring dx: chronic right-sided low back pain without sciatica  Rationale for Evaluation and Treatment: Rehabilitation  THERAPY DIAG:  Other low back pain  Pain in thoracic spine  ONSET DATE: 2-3 months ago, pt was working and picked up some limbs and twisted.   SUBJECTIVE:                                                                                                                                                                                            SUBJECTIVE STATEMENT: Low back pain   PERTINENT HISTORY:  Low back pain R side.  Pain started a few years ago. Pt sleeps on his R side. Back bothers him when he changes position such as getting up in the morning. Doctor thinks the pain is muscular. Has had low back pain since he was about 68 years old,  pain comes and goes.   No LE radiating symptoms.      Blood pressure is controlled per pt.    PAIN:  Are you having pain? Yes: NPRS scale: 0/10 currently Pain location: R thoracolumbar paraspinal area and R iliac crest.  Pain description: sharp, radiating, electrical  Aggravating factors: changing positions such as getting up from the floor. Getting down to the ground and getting up from the ground. Getting hit from the side. Pain bothers him first thing in the morning. Relieving factors: sleeping on a recliner; supine with feet propped on pillows.  Reclined position  PRECAUTIONS: no known precautions.   RED FLAGS: Bowel or bladder incontinence: No and Cauda equina syndrome: Yes: when sitting on a toilet or something hard; rarely ever happens.     WEIGHT BEARING RESTRICTIONS: No  FALLS:  Has patient fallen in last 6 months? No  LIVING ENVIRONMENT: Lives with: lives alone Lives in: House/apartment Stairs: Yes: External: 4 steps; on left going up Has following equipment at home: None  OCCUPATION: retired  PLOF: Independent  PATIENT GOALS: make the pain Nickolas Chalfin away or find out what it is.   NEXT MD VISIT: later on this year  OBJECTIVE:  Note: Objective measures were completed at Evaluation unless otherwise noted.  DIAGNOSTIC FINDINGS:    PATIENT SURVEYS:  Modified Oswestry:  MODIFIED OSWESTRY DISABILITY SCALE  Date: 08/30/2024 Score  Pain intensity 0 = I can tolerate the pain I have without having to use pain medication.  2. Personal care (washing, dressing, etc.) 0 =  I can  take care of myself normally without causing increased pain.  3. Lifting 0 = I can lift heavy weights without increased pain.  4. Walking 0 = Pain does not prevent me from walking any distance  5. Sitting 0 =  I can sit in any chair as long as I like.  6. Standing 0 =  I can stand as long as I want without increased pain.  7. Sleeping 0 = Pain does not prevent me from sleeping well.  8. Social Life 0 = My social life is normal and does not increase my pain.  9. Traveling 0 =  I can travel anywhere without increased pain.  10. Employment/ Homemaking 1 = My normal homemaking/job activities increase my pain, but I can still perform all that is required of me  Total 1/50 (2%)   Interpretation of scores: Score Category Description  0-20% Minimal Disability The patient can cope with most living activities. Usually no treatment is indicated apart from advice on lifting, sitting and exercise  21-40% Moderate Disability The patient experiences more pain and difficulty with sitting, lifting and standing. Travel and social life are more difficult and they may be disabled from work. Personal care, sexual activity and sleeping are not grossly affected, and the patient can usually be managed by conservative means  41-60% Severe Disability Pain remains the main problem in this group, but activities of daily living are affected. These patients require a detailed investigation  61-80% Crippled Back pain impinges on all aspects of the patient's life. Positive intervention is required  81-100% Bed-bound These patients are either bed-bound or exaggerating their symptoms  [name], et al. Surgery versus conservative management of stable thoracolumbar fracture: the PRESTO feasibility RCT. [place] ([place]): [employer]; 2021 Nov. [facility] Technology Assessment, No. 25.62.) Appendix 3, Oswestry Disability Index category descriptors. Available from:  [email or web address].cz  Minimally Clinically Important Difference (MCID) = 12.8%  COGNITION: Overall cognitive status: Within functional limits for tasks assessed     SENSATION:   MUSCLE LENGTH:   POSTURE: B protracted shoulders, R scapular protraction > L, thoracic kyphosis, movement preference around L3/4 area with R trunk rotation, B genu valgus and B foot pronation.   PALPATION: Muscle tension R thoracolumbar area.   LUMBAR ROM:   AROM eval  Flexion Limited, slight aberrant movement/pt about to walk up with hands when returning to neutral.   Extension Limited with thoracolumbar spine tightness similar to area of pain  Right lateral flexion Limited with R iliac crest area tightness, focused around L5/S1 area  Left lateral flexion Limited with R iliac crest area tightness  Right rotation Limited with R thoracolumbar paraspinal tightness  Left rotation Limited with L posterior trunk tightness.    (Blank rows = not tested)  LOWER EXTREMITY ROM:     Passive  Right eval Left eval  Hip flexion    Hip extension    Hip abduction    Hip adduction    Hip internal rotation    Hip external rotation    Knee flexion    Knee extension    Ankle dorsiflexion    Ankle plantarflexion    Ankle inversion    Ankle eversion     (Blank rows = not tested)  LOWER EXTREMITY MMT:    MMT Right eval Left eval  Hip flexion 4 4-  Hip extension (seated manually resisted) 3 3+  Hip abduction 4- 4-  Hip adduction    Hip internal rotation    Hip external rotation    Knee flexion 5 4  Knee extension 5 5  Ankle dorsiflexion    Ankle plantarflexion    Ankle inversion    Ankle eversion     (Blank rows = not tested)  Reproduction of back pain when moving from one side lying position to supine to the other side lying position.   Low back pain with R S/L to sit transfer    LUMBAR SPECIAL TESTS:  (-) repeated flexion test  FUNCTIONAL TESTS:     GAIT: Distance walked: 60 ft Assistive device utilized: None Level of assistance: Complete Independence Comments: antalgic, decreased stance L LE with R pelvic drop  TREATMENT DATE: 09/18/24                                                                                                                               Subjective: Patient reports that pain specifically occurring with transitions from supine <> sitting <> standing. He reports he has had minimal improvements   No questions or concerns.   Therapeutic Exercise (focused on hip and core strengthening/stabilization):    Hip Matrix Cable Machine  Hip Abduction   R/L: 1 x 10 25#, 2 x 10 40#  Hooklying Alternating LE march with Pallof Press   2 x 16 reps    Hooklying - R Leg Straight lift with OH reach  2 x 10 reps - 3 Kg MB  Hooklying Posterior  Pelvic Tilt   2 x 10 x 3s   Supine Bridge with TA activation  2 x 10   Seated Forward Lumbar Flexion Rollout  1 x 10 fwd with white physioball    Therapeutic Activity (with intent to strengthen core and gluteal muscles during functional activities):   NuStep (seat 11) x 6 min x Level 6-2 UE/LE for improved LE strength, endurance and core strength. PT manually adjusted resistance.    Lateral Stepping against Resisted   4 x 12' - Blue TB around ankles   4 x 12' - Blue TB around ankles  4 x 12' - Blue TB around ankles   Standing pallof press at Cables  R/L: 1 x 10 - 5#, 1 x 10 - 10#   Pallof Walk out   R/L: 1 x 10 - 10#    PATIENT EDUCATION:  Education details: there-ex, HEP, POC Person educated: Patient Education method: Explanation, Demonstration, Verbal cues, and Handouts Education comprehension: verbalized understanding and returned demonstration  HOME EXERCISE PROGRAM: Access Code: [email or web address] URL: [email or web address] Date: 09/18/2024 Prepared by: Lonni Pall  Exercises - Supine Transversus Abdominis Bracing - Hands on Stomach  - 3 x daily  - 7 x weekly - 3 sets - 10 reps - 5 seconds hold - Supine Posterior Pelvic Tilt  - 1 x daily - 3-4 x weekly - 2-3 sets - 10-12 reps - 5 hold - Neutral Curl Up with Straight Leg  - 1 x daily - 3-4 x weekly - 2-3 sets - 10-12 reps - 10s hold - Side Stepping with Resistance at Feet  - 1 x daily - 3-4 x weekly - 2-3 sets - 10 reps - Supine Bridge  - 1 x daily - 3-4 x weekly - 2-3 sets - 8-10 reps - Standing Anti-Rotation Press with Anchored Resistance  - 1 x daily - 3-4 x weekly - 2-3 sets - 10-12 reps  Access Code: [email or web address] URL: [email or web address] Date: 09/03/2024 Prepared by: Lonni Pall  Exercises - Supine Transversus Abdominis Bracing - Hands on Stomach  - 3 x daily - 7 x weekly - 3 sets - 10 reps - 5 seconds hold - Supine Posterior Pelvic Tilt  - 1 x daily - 3-4 x weekly - 2-3 sets - 10-12 reps - 5 hold - Neutral Curl Up with Straight Leg  - 1 x daily - 3-4 x weekly - 2-3 sets - 10-12 reps - 10s hold  Access Code: [email or web address] URL: [email or web address] Date: 08/30/2024 Prepared by: Emil Glassman  Exercises - Supine Transversus Abdominis Bracing - Hands on Stomach  - 3 x daily - 7 x weekly - 3 sets - 10 reps - 5 seconds hold  ASSESSMENT:  CLINICAL IMPRESSION: Continued PT POC focused on management of LBP. Session with a main focus on gluteal strengthening and core stability in standing positions. He tolerated all exercises without exacerbation of pain in the lower back. Good carryover with bed mobility transfer from supine <> sitting, however patient with moderate back pain during transitions, even with TA activation.  Based on today's performance the patient will benefit from skilled PT services in order to improve lower back pain and maximize return to PLOF.    OBJECTIVE IMPAIRMENTS: decreased strength, improper body mechanics, postural dysfunction, and pain.   ACTIVITY LIMITATIONS: bending, standing, transfers, and bed mobility  PARTICIPATION  LIMITATIONS:   PERSONAL FACTORS: Age, Fitness, Time since onset of injury/illness/exacerbation, and 3+ comorbidities:  Depression, HTN, BPH, Elevated liver function tests are also affecting patient's functional outcome.   REHAB POTENTIAL: Fair    CLINICAL DECISION MAKING: Stable/uncomplicated  EVALUATION COMPLEXITY: Low   GOALS: Goals reviewed with patient? Yes  SHORT TERM GOALS: Target date: 09/14/2024  Pt will be independent with his initial HEP to decrease pain, improve strength, function, and ability to change positions more comfortably.  Baseline: Pt has started his initial HEP (08/30/2024). Goal status: INITIAL    LONG TERM GOALS: Target date: 10/26/2024  Pt will improve his B hip extension and abduction by at least 1/2 MMT grade to promote ability to change positions with less back discomfort.  Baseline:  MMT Right eval Left eval  Hip extension (seated manually resisted) 3 3+  Hip abduction 4- 4-   Goal status: INITIAL  2.  Pt will be able to transition from sitting <> standing as well as from supine <> standing without back pain to promote mobility.  Baseline: back pain reported with getting up from the sitting or lying down position (08/30/2024) Goal status: INITIAL  3.  Pt will be able to perform at least 3 floor <> stand transfers without back pain to promote mobility.  Baseline: increased back pain with the aforementioned transfer. Goal status: INITIAL   PLAN:  PT FREQUENCY: 1-2x/week  PT DURATION: 8 weeks  PLANNED INTERVENTIONS: 97110-Therapeutic exercises, 97530- Therapeutic activity, [id number]- Neuromuscular re-education, 97535- Self Care, [id number]- Manual therapy, [phone]- Aquatic Therapy, [phone]- Electrical stimulation (unattended), [phone]- Traction (mechanical), [id number]- Ionotophoresis 4mg /ml Dexamethasone, Patient/Family education, Joint mobilization, and Spinal mobilization.  PLAN FOR NEXT SESSION: Posture, thoracolumbar mobility, trunk and glute strengthening,  lumbopelvic mechanics/control, manual techniques, modalities PRN.   Lonni Pall PT, DPT Physical Therapist- [place]  09/18/2024, 1:50 PM

## 2024-09-25 ENCOUNTER — Ambulatory Visit

## 2024-09-25 DIAGNOSIS — M5459 Other low back pain: Secondary | ICD-10-CM

## 2024-09-25 DIAGNOSIS — M546 Pain in thoracic spine: Secondary | ICD-10-CM

## 2024-09-25 NOTE — Therapy (Signed)
 OUTPATIENT PHYSICAL THERAPY THORACOLUMBAR TREATMENT   Patient Name: Eric Spears MRN: [id number] DOB:11/25/1955, 68 y.o., male Today's Date: 09/25/2024  END OF SESSION:  PT End of Session - 09/25/24 1123     Visit Number 6    Number of Visits 17    Date for Recertification  10/26/24    PT Start Time 1116    PT Stop Time 1157    PT Time Calculation (min) 41 min    Activity Tolerance Patient tolerated treatment well    Behavior During Therapy [facility] for tasks assessed/performed           Past Medical History:  Diagnosis Date   Allergy    BPH (benign prostatic hyperplasia)    Depression    ED (erectile dysfunction)    Elevated liver function tests    GERD (gastroesophageal reflux disease)    Hypertension    Low HDL (under 40) 10/05   Past Surgical History:  Procedure Laterality Date   Finger trauma     NASAL SEPTUM SURGERY     Nose   Stress cardiolite  1998   Normal   Patient Active Problem List   Diagnosis Date Noted   Abrasion, leg w/ infection 08/22/2024   Abnormality of abdominal aorta 10/18/2023   Hyperlipidemia 11/01/2018   Hypertension 10/03/2018   Morbid obesity (HCC) 10/03/2018   Enlargement of abdominal aorta 12/26/2017   Fatty liver 10/19/2017   Elevated glucose level 10/18/2016   Somnolence, daytime 11/14/2014   Routine general medical examination at a health care facility 11/05/2014   Prostate cancer screening 11/05/2014   Colon cancer screening 07/03/2014   Low back pain 03/17/2011    PCP: Eric Laine LABOR, MD   REFERRING PROVIDER: [name], MD   REFERRING DIAG: Referring dx: chronic right-sided low back pain without sciatica  Rationale for Evaluation and Treatment: Rehabilitation  THERAPY DIAG:  Other low back pain  Pain in thoracic spine  ONSET DATE: 2-3 months ago, pt was working and picked up some limbs and twisted.   SUBJECTIVE:                                                                                                                                                                                            SUBJECTIVE STATEMENT: Low back pain   PERTINENT HISTORY:  Low back pain R side.  Pain started a few years ago. Pt sleeps on his R side. Back bothers him when he changes position such as getting up in the morning. Doctor thinks the pain is muscular. Has had low back pain since he was about 68 years old,  pain comes and goes.   No LE radiating symptoms.      Blood pressure is controlled per pt.    PAIN:  Are you having pain? Yes: NPRS scale: 0/10 currently Pain location: R thoracolumbar paraspinal area and R iliac crest.  Pain description: sharp, radiating, electrical  Aggravating factors: changing positions such as getting up from the floor. Getting down to the ground and getting up from the ground. Getting hit from the side. Pain bothers him first thing in the morning. Relieving factors: sleeping on a recliner; supine with feet propped on pillows.  Reclined position  PRECAUTIONS: no known precautions.   RED FLAGS: Bowel or bladder incontinence: No and Cauda equina syndrome: Yes: when sitting on a toilet or something hard; rarely ever happens.     WEIGHT BEARING RESTRICTIONS: No  FALLS:  Has patient fallen in last 6 months? No  LIVING ENVIRONMENT: Lives with: lives alone Lives in: House/apartment Stairs: Yes: External: 4 steps; on left going up Has following equipment at home: None  OCCUPATION: retired  PLOF: Independent  PATIENT GOALS: make the pain Eric Spears away or find out what it is.   NEXT MD VISIT: later on this year  OBJECTIVE:  Note: Objective measures were completed at Evaluation unless otherwise noted.  DIAGNOSTIC FINDINGS:    PATIENT SURVEYS:  Modified Oswestry:  MODIFIED OSWESTRY DISABILITY SCALE  Date: 08/30/2024 Score  Pain intensity 0 = I can tolerate the pain I have without having to use pain medication.  2. Personal care (washing, dressing, etc.) 0 =  I can  take care of myself normally without causing increased pain.  3. Lifting 0 = I can lift heavy weights without increased pain.  4. Walking 0 = Pain does not prevent me from walking any distance  5. Sitting 0 =  I can sit in any chair as long as I like.  6. Standing 0 =  I can stand as long as I want without increased pain.  7. Sleeping 0 = Pain does not prevent me from sleeping well.  8. Social Life 0 = My social life is normal and does not increase my pain.  9. Traveling 0 =  I can travel anywhere without increased pain.  10. Employment/ Homemaking 1 = My normal homemaking/job activities increase my pain, but I can still perform all that is required of me  Total 1/50 (2%)   Interpretation of scores: Score Category Description  0-20% Minimal Disability The patient can cope with most living activities. Usually no treatment is indicated apart from advice on lifting, sitting and exercise  21-40% Moderate Disability The patient experiences more pain and difficulty with sitting, lifting and standing. Travel and social life are more difficult and they may be disabled from work. Personal care, sexual activity and sleeping are not grossly affected, and the patient can usually be managed by conservative means  41-60% Severe Disability Pain remains the main problem in this group, but activities of daily living are affected. These patients require a detailed investigation  61-80% Crippled Back pain impinges on all aspects of the patient's life. Positive intervention is required  81-100% Bed-bound These patients are either bed-bound or exaggerating their symptoms  [name], et al. Surgery versus conservative management of stable thoracolumbar fracture: the PRESTO feasibility RCT. [place] ([place]): [employer]; 2021 Nov. [facility] Technology Assessment, No. 25.62.) Appendix 3, Oswestry Disability Index category descriptors. Available from:  [email or web address].cz  Minimally Clinically Important Difference (MCID) = 12.8%  COGNITION: Overall cognitive status: Within functional limits for tasks assessed     SENSATION:   MUSCLE LENGTH:   POSTURE: B protracted shoulders, R scapular protraction > L, thoracic kyphosis, movement preference around L3/4 area with R trunk rotation, B genu valgus and B foot pronation.   PALPATION: Muscle tension R thoracolumbar area.   LUMBAR ROM:   AROM eval  Flexion Limited, slight aberrant movement/pt about to walk up with hands when returning to neutral.   Extension Limited with thoracolumbar spine tightness similar to area of pain  Right lateral flexion Limited with R iliac crest area tightness, focused around L5/S1 area  Left lateral flexion Limited with R iliac crest area tightness  Right rotation Limited with R thoracolumbar paraspinal tightness  Left rotation Limited with L posterior trunk tightness.    (Blank rows = not tested)  LOWER EXTREMITY ROM:     Passive  Right eval Left eval  Hip flexion    Hip extension    Hip abduction    Hip adduction    Hip internal rotation    Hip external rotation    Knee flexion    Knee extension    Ankle dorsiflexion    Ankle plantarflexion    Ankle inversion    Ankle eversion     (Blank rows = not tested)  LOWER EXTREMITY MMT:    MMT Right eval Left eval  Hip flexion 4 4-  Hip extension (seated manually resisted) 3 3+  Hip abduction 4- 4-  Hip adduction    Hip internal rotation    Hip external rotation    Knee flexion 5 4  Knee extension 5 5  Ankle dorsiflexion    Ankle plantarflexion    Ankle inversion    Ankle eversion     (Blank rows = not tested)  Reproduction of back pain when moving from one side lying position to supine to the other side lying position.   Low back pain with R S/L to sit transfer    LUMBAR SPECIAL TESTS:  (-) repeated flexion test  FUNCTIONAL TESTS:     GAIT: Distance walked: 60 ft Assistive device utilized: None Level of assistance: Complete Independence Comments: antalgic, decreased stance L LE with R pelvic drop  TREATMENT DATE: 09/25/24                                                                                                                               Subjective: Patient reports feeling fatigued prior to start of session. No pain at rest at beginning of session. No questions or concerns.   Therapeutic Exercise (focused on hip and core strengthening/stabilization):   Supine Bridge with TA activation  2 x 10 x 1s hold   Hooklying - RLE Straight with OH reach for core stability   2 x 10 - OH reach against Blue TB    Seated Forward Lumbar Flexion Rollout  2 x 15 fwd with white physioball  Therapeutic Activity (with intent to strengthen core and gluteal muscles during functional activities):   NuStep (seat 11) x 6 min x Level 6-2 UE/LE for improved LE strength, endurance and core strength. PT manually adjusted resistance.    Lateral Stepping against Resisted   4 x 12' - Blue TB around ankles   4 x 12' - Blue TB around ankles  4 x 12' - Blue TB around ankles   Standing Pallof Press  1 x 10 - 5# 2 x 12 - 10#   Pallof Walkout   3 x 10 - 5#   Barbell Twist  Seated - 1 x 20   Standing - 2 x 20   Lateral Stepping   4 x 12' - Grey TB around thigh   4 x 12' - Grey TB around thigh  4 x 12' - Grey TB around    PATIENT EDUCATION:  Education details: Exercise Technique  Person educated: Patient Education method: Programmer, Multimedia, Facilities Manager, Verbal cues, and Handouts Education comprehension: verbalized understanding and returned demonstration  HOME EXERCISE PROGRAM: Access Code: [email or web address] URL: [email or web address] Date: 09/18/2024 Prepared by: Lonni Pall  Exercises - Supine Transversus Abdominis Bracing - Hands on Stomach  - 3 x daily - 7 x weekly - 3 sets - 10 reps - 5 seconds hold -  Supine Posterior Pelvic Tilt  - 1 x daily - 3-4 x weekly - 2-3 sets - 10-12 reps - 5 hold - Neutral Curl Up with Straight Leg  - 1 x daily - 3-4 x weekly - 2-3 sets - 10-12 reps - 10s hold - Side Stepping with Resistance at Feet  - 1 x daily - 3-4 x weekly - 2-3 sets - 10 reps - Supine Bridge  - 1 x daily - 3-4 x weekly - 2-3 sets - 8-10 reps - Standing Anti-Rotation Press with Anchored Resistance  - 1 x daily - 3-4 x weekly - 2-3 sets - 10-12 reps  Access Code: [email or web address] URL: [email or web address] Date: 09/03/2024 Prepared by: Lonni Pall  Exercises - Supine Transversus Abdominis Bracing - Hands on Stomach  - 3 x daily - 7 x weekly - 3 sets - 10 reps - 5 seconds hold - Supine Posterior Pelvic Tilt  - 1 x daily - 3-4 x weekly - 2-3 sets - 10-12 reps - 5 hold - Neutral Curl Up with Straight Leg  - 1 x daily - 3-4 x weekly - 2-3 sets - 10-12 reps - 10s hold  Access Code: [email or web address] URL: [email or web address] Date: 08/30/2024 Prepared by: Emil Glassman  Exercises - Supine Transversus Abdominis Bracing - Hands on Stomach  - 3 x daily - 7 x weekly - 3 sets - 10 reps - 5 seconds hold  ASSESSMENT:  CLINICAL IMPRESSION: Continued PT POC focused on management of LBP. Good tolerance to progression of core stabilization exercises. Able to perform rotational movements while maintaining TA activation. He still presenting with transitional pain from R sidelying <> sitting; however less pain noted with L sidelying <> sitting. PT advised to modify bed if possible in order to decrease pain and transition from L side. Based on today's performance the patient will benefit from skilled PT services in order to improve lower back pain and maximize return to PLOF.   OBJECTIVE IMPAIRMENTS: decreased strength, improper body mechanics, postural dysfunction, and pain.   ACTIVITY LIMITATIONS: bending, standing, transfers, and bed mobility  PARTICIPATION LIMITATIONS:   PERSONAL  FACTORS: Age, Fitness, Time since onset of injury/illness/exacerbation, and 3+ comorbidities: Depression, HTN,  BPH, Elevated liver function tests are also affecting patient's functional outcome.   REHAB POTENTIAL: Fair    CLINICAL DECISION MAKING: Stable/uncomplicated  EVALUATION COMPLEXITY: Low   GOALS: Goals reviewed with patient? Yes  SHORT TERM GOALS: Target date: 09/14/2024  Pt will be independent with his initial HEP to decrease pain, improve strength, function, and ability to change positions more comfortably.  Baseline: Pt has started his initial HEP (08/30/2024). Goal status: INITIAL    LONG TERM GOALS: Target date: 10/26/2024  Pt will improve his B hip extension and abduction by at least 1/2 MMT grade to promote ability to change positions with less back discomfort.  Baseline:  MMT Right eval Left eval  Hip extension (seated manually resisted) 3 3+  Hip abduction 4- 4-   Goal status: INITIAL  2.  Pt will be able to transition from sitting <> standing as well as from supine <> standing without back pain to promote mobility.  Baseline: back pain reported with getting up from the sitting or lying down position (08/30/2024) Goal status: INITIAL  3.  Pt will be able to perform at least 3 floor <> stand transfers without back pain to promote mobility.  Baseline: increased back pain with the aforementioned transfer. Goal status: INITIAL   PLAN:  PT FREQUENCY: 1-2x/week  PT DURATION: 8 weeks  PLANNED INTERVENTIONS: 97110-Therapeutic exercises, 97530- Therapeutic activity, [id number]- Neuromuscular re-education, 97535- Self Care, [id number]- Manual therapy, [phone]- Aquatic Therapy, [phone]- Electrical stimulation (unattended), [phone]- Traction (mechanical), [id number]- Ionotophoresis 4mg /ml Dexamethasone, Patient/Family education, Joint mobilization, and Spinal mobilization.  PLAN FOR NEXT SESSION: Posture, thoracolumbar mobility, trunk and glute strengthening, lumbopelvic  mechanics/control, manual techniques, modalities PRN.   Lonni Pall PT, DPT Physical Therapist- [place]  09/25/2024, 12:02 PM

## 2024-09-27 ENCOUNTER — Ambulatory Visit

## 2024-09-27 DIAGNOSIS — M5459 Other low back pain: Secondary | ICD-10-CM | POA: Diagnosis not present

## 2024-09-27 DIAGNOSIS — M546 Pain in thoracic spine: Secondary | ICD-10-CM

## 2024-09-27 NOTE — Therapy (Signed)
 OUTPATIENT PHYSICAL THERAPY THORACOLUMBAR TREATMENT   Patient Name: Eric Spears MRN: [id number] DOB:1956-01-14, 68 y.o., male Today's Date: 09/27/2024  END OF SESSION:  PT End of Session - 09/27/24 1119     Visit Number 7    Number of Visits 17    Date for Recertification  10/26/24    PT Start Time 1118    PT Stop Time 1200    PT Time Calculation (min) 42 min    Activity Tolerance Patient tolerated treatment well    Behavior During Therapy [facility] for tasks assessed/performed            Past Medical History:  Diagnosis Date   Allergy    BPH (benign prostatic hyperplasia)    Depression    ED (erectile dysfunction)    Elevated liver function tests    GERD (gastroesophageal reflux disease)    Hypertension    Low HDL (under 40) 10/05   Past Surgical History:  Procedure Laterality Date   Finger trauma     NASAL SEPTUM SURGERY     Nose   Stress cardiolite  1998   Normal   Patient Active Problem List   Diagnosis Date Noted   Abrasion, leg w/ infection 08/22/2024   Abnormality of abdominal aorta 10/18/2023   Hyperlipidemia 11/01/2018   Hypertension 10/03/2018   Morbid obesity (HCC) 10/03/2018   Enlargement of abdominal aorta 12/26/2017   Fatty liver 10/19/2017   Elevated glucose level 10/18/2016   Somnolence, daytime 11/14/2014   Routine general medical examination at a health care facility 11/05/2014   Prostate cancer screening 11/05/2014   Colon cancer screening 07/03/2014   Low back pain 03/17/2011    PCP: [name], MD   REFERRING PROVIDER: [name], MD   REFERRING DIAG: Referring dx: chronic right-sided low back pain without sciatica  Rationale for Evaluation and Treatment: Rehabilitation  THERAPY DIAG:  Other low back pain  Pain in thoracic spine  ONSET DATE: 2-3 months ago, pt was working and picked up some limbs and twisted.   SUBJECTIVE:                                                                                                                                                                                            SUBJECTIVE STATEMENT: Low back pain   PERTINENT HISTORY:  Low back pain R side.  Pain started a few years ago. Pt sleeps on his R side. Back bothers him when he changes position such as getting up in the morning. Doctor thinks the pain is muscular. Has had low back pain since he was about 68 years  old, pain comes and goes.   No LE radiating symptoms.      Blood pressure is controlled per pt.    PAIN:  Are you having pain? Yes: NPRS scale: 0/10 currently Pain location: R thoracolumbar paraspinal area and R iliac crest.  Pain description: sharp, radiating, electrical  Aggravating factors: changing positions such as getting up from the floor. Getting down to the ground and getting up from the ground. Getting hit from the side. Pain bothers him first thing in the morning. Relieving factors: sleeping on a recliner; supine with feet propped on pillows.  Reclined position  PRECAUTIONS: no known precautions.   RED FLAGS: Bowel or bladder incontinence: No and Cauda equina syndrome: Yes: when sitting on a toilet or something hard; rarely ever happens.     WEIGHT BEARING RESTRICTIONS: No  FALLS:  Has patient fallen in last 6 months? No  LIVING ENVIRONMENT: Lives with: lives alone Lives in: House/apartment Stairs: Yes: External: 4 steps; on left going up Has following equipment at home: None  OCCUPATION: retired  PLOF: Independent  PATIENT GOALS: make the pain Alvy Alsop away or find out what it is.   NEXT MD VISIT: later on this year  OBJECTIVE:  Note: Objective measures were completed at Evaluation unless otherwise noted.  DIAGNOSTIC FINDINGS:    PATIENT SURVEYS:  Modified Oswestry:  MODIFIED OSWESTRY DISABILITY SCALE  Date: 08/30/2024 Score  Pain intensity 0 = I can tolerate the pain I have without having to use pain medication.  2. Personal care (washing, dressing, etc.) 0 =  I can  take care of myself normally without causing increased pain.  3. Lifting 0 = I can lift heavy weights without increased pain.  4. Walking 0 = Pain does not prevent me from walking any distance  5. Sitting 0 =  I can sit in any chair as long as I like.  6. Standing 0 =  I can stand as long as I want without increased pain.  7. Sleeping 0 = Pain does not prevent me from sleeping well.  8. Social Life 0 = My social life is normal and does not increase my pain.  9. Traveling 0 =  I can travel anywhere without increased pain.  10. Employment/ Homemaking 1 = My normal homemaking/job activities increase my pain, but I can still perform all that is required of me  Total 1/50 (2%)   Interpretation of scores: Score Category Description  0-20% Minimal Disability The patient can cope with most living activities. Usually no treatment is indicated apart from advice on lifting, sitting and exercise  21-40% Moderate Disability The patient experiences more pain and difficulty with sitting, lifting and standing. Travel and social life are more difficult and they may be disabled from work. Personal care, sexual activity and sleeping are not grossly affected, and the patient can usually be managed by conservative means  41-60% Severe Disability Pain remains the main problem in this group, but activities of daily living are affected. These patients require a detailed investigation  61-80% Crippled Back pain impinges on all aspects of the patient's life. Positive intervention is required  81-100% Bed-bound These patients are either bed-bound or exaggerating their symptoms  [name], et al. Surgery versus conservative management of stable thoracolumbar fracture: the PRESTO feasibility RCT. [place] ([place]): [employer]; 2021 Nov. [facility] Technology Assessment, No. 25.62.) Appendix 3, Oswestry Disability Index category descriptors. Available from:  [email or web address].cz  Minimally Clinically Important Difference (MCID) = 12.8%  COGNITION: Overall cognitive status: Within functional limits for tasks assessed     SENSATION:   MUSCLE LENGTH:   POSTURE: B protracted shoulders, R scapular protraction > L, thoracic kyphosis, movement preference around L3/4 area with R trunk rotation, B genu valgus and B foot pronation.   PALPATION: Muscle tension R thoracolumbar area.   LUMBAR ROM:   AROM eval  Flexion Limited, slight aberrant movement/pt about to walk up with hands when returning to neutral.   Extension Limited with thoracolumbar spine tightness similar to area of pain  Right lateral flexion Limited with R iliac crest area tightness, focused around L5/S1 area  Left lateral flexion Limited with R iliac crest area tightness  Right rotation Limited with R thoracolumbar paraspinal tightness  Left rotation Limited with L posterior trunk tightness.    (Blank rows = not tested)  LOWER EXTREMITY ROM:     Passive  Right eval Left eval  Hip flexion    Hip extension    Hip abduction    Hip adduction    Hip internal rotation    Hip external rotation    Knee flexion    Knee extension    Ankle dorsiflexion    Ankle plantarflexion    Ankle inversion    Ankle eversion     (Blank rows = not tested)  LOWER EXTREMITY MMT:    MMT Right eval Left eval  Hip flexion 4 4-  Hip extension (seated manually resisted) 3 3+  Hip abduction 4- 4-  Hip adduction    Hip internal rotation    Hip external rotation    Knee flexion 5 4  Knee extension 5 5  Ankle dorsiflexion    Ankle plantarflexion    Ankle inversion    Ankle eversion     (Blank rows = not tested)  Reproduction of back pain when moving from one side lying position to supine to the other side lying position.   Low back pain with R S/L to sit transfer    LUMBAR SPECIAL TESTS:  (-) repeated flexion test  FUNCTIONAL TESTS:     GAIT: Distance walked: 60 ft Assistive device utilized: None Level of assistance: Complete Independence Comments: antalgic, decreased stance L LE with R pelvic drop  TREATMENT DATE: 09/27/24                                                                                                                               Subjective: Patient reports no pain in his lower back resting. He still has persistent pain strictly with transitions (R sidelying <> sitting <> standing). No questions or concerns.   Therapeutic Activity (with intent to strengthen core and gluteal muscles during functional activities such as transitioning, lifting, bending):   NuStep (seat 11) x 6 min x Level 6-2 UE/LE for improved LE strength, endurance and core strength. PT manually adjusted resistance.     Standing Upward Diagonal Rotation against Resistance  R/L: 3 x 10 - Blue TB   Barbell Landmine Twist    Standing, Staggered Stance - 3 x 20 reps - 45# barbell   Bent over Row on Mat Table - with TA Activation   R/L: 1 x 10 - 15# DB, 2 x 10 - 20# KB   Standing Pallof Press  1 x 10 - 5# 2 x 12 - 10#    Standing Lateral Trunk Flexion against resistance   R/L: 2 x 10 - 20# KB   Lateral Stepping against resistance   4 x 12' - Grey TB   4 x 12'  - Grey TB     PATIENT EDUCATION:  Education details: Exercise Technique  Person educated: Patient Education method: Programmer, Multimedia, Facilities Manager, Verbal cues, and Handouts Education comprehension: verbalized understanding and returned demonstration  HOME EXERCISE PROGRAM: Access Code: [email or web address] URL: [email or web address] Date: 09/18/2024 Prepared by: Lonni Pall  Exercises - Supine Transversus Abdominis Bracing - Hands on Stomach  - 3 x daily - 7 x weekly - 3 sets - 10 reps - 5 seconds hold - Supine Posterior Pelvic Tilt  - 1 x daily - 3-4 x weekly - 2-3 sets - 10-12 reps - 5 hold - Neutral Curl Up with Straight Leg  - 1 x daily - 3-4 x weekly  - 2-3 sets - 10-12 reps - 10s hold - Side Stepping with Resistance at Feet  - 1 x daily - 3-4 x weekly - 2-3 sets - 10 reps - Supine Bridge  - 1 x daily - 3-4 x weekly - 2-3 sets - 8-10 reps - Standing Anti-Rotation Press with Anchored Resistance  - 1 x daily - 3-4 x weekly - 2-3 sets - 10-12 reps  Access Code: [email or web address] URL: [email or web address] Date: 09/03/2024 Prepared by: Lonni Pall  Exercises - Supine Transversus Abdominis Bracing - Hands on Stomach  - 3 x daily - 7 x weekly - 3 sets - 10 reps - 5 seconds hold - Supine Posterior Pelvic Tilt  - 1 x daily - 3-4 x weekly - 2-3 sets - 10-12 reps - 5 hold - Neutral Curl Up with Straight Leg  - 1 x daily - 3-4 x weekly - 2-3 sets - 10-12 reps - 10s hold  Access Code: [email or web address] URL: [email or web address] Date: 08/30/2024 Prepared by: Emil Glassman  Exercises - Supine Transversus Abdominis Bracing - Hands on Stomach  - 3 x daily - 7 x weekly - 3 sets - 10 reps - 5 seconds hold  ASSESSMENT:  CLINICAL IMPRESSION: Continued PT POC focused on management of LBP. Good tolerance to progression of core stabilization exercises. PT progressed core strengthening through functional activities that challenged transverse abdominis activation with rotational movements. No exacerbation of pain with bent over rows or resistance with diagonal movements. PT plan to continue progressing core stabilization in order to improve TA activation and optimize transitional movements. Based on today's performance the patient will benefit from skilled PT services in order to improve lower back pain and maximize return to PLOF.   OBJECTIVE IMPAIRMENTS: decreased strength, improper body mechanics, postural dysfunction, and pain.   ACTIVITY LIMITATIONS: bending, standing, transfers, and bed mobility  PARTICIPATION LIMITATIONS:   PERSONAL FACTORS: Age, Fitness, Time since onset of injury/illness/exacerbation, and 3+ comorbidities:  Depression, HTN, BPH, Elevated liver function tests are also affecting patient's functional outcome.   REHAB POTENTIAL: Fair    CLINICAL DECISION MAKING: Stable/uncomplicated  EVALUATION COMPLEXITY: Low   GOALS: Goals reviewed with patient? Yes  SHORT TERM GOALS:  Target date: 09/14/2024  Pt will be independent with his initial HEP to decrease pain, improve strength, function, and ability to change positions more comfortably.  Baseline: Pt has started his initial HEP (08/30/2024). Goal status: INITIAL    LONG TERM GOALS: Target date: 10/26/2024  Pt will improve his B hip extension and abduction by at least 1/2 MMT grade to promote ability to change positions with less back discomfort.  Baseline:  MMT Right eval Left eval  Hip extension (seated manually resisted) 3 3+  Hip abduction 4- 4-   Goal status: INITIAL  2.  Pt will be able to transition from sitting <> standing as well as from supine <> standing without back pain to promote mobility.  Baseline: back pain reported with getting up from the sitting or lying down position (08/30/2024) Goal status: INITIAL  3.  Pt will be able to perform at least 3 floor <> stand transfers without back pain to promote mobility.  Baseline: increased back pain with the aforementioned transfer. Goal status: INITIAL   PLAN:  PT FREQUENCY: 1-2x/week  PT DURATION: 8 weeks  PLANNED INTERVENTIONS: 97110-Therapeutic exercises, 97530- Therapeutic activity, [id number]- Neuromuscular re-education, 97535- Self Care, [id number]- Manual therapy, [phone]- Aquatic Therapy, [phone]- Electrical stimulation (unattended), [phone]- Traction (mechanical), [id number]- Ionotophoresis 4mg /ml Dexamethasone, Patient/Family education, Joint mobilization, and Spinal mobilization.  PLAN FOR NEXT SESSION: Posture, thoracolumbar mobility, trunk and glute strengthening, lumbopelvic mechanics/control, manual techniques, modalities PRN.   Lonni Pall PT, DPT Physical Therapist- [facility]  09/27/2024, 12:34 PM

## 2024-10-01 ENCOUNTER — Ambulatory Visit

## 2024-10-01 DIAGNOSIS — M5459 Other low back pain: Secondary | ICD-10-CM

## 2024-10-01 DIAGNOSIS — M546 Pain in thoracic spine: Secondary | ICD-10-CM

## 2024-10-01 NOTE — Therapy (Signed)
 OUTPATIENT PHYSICAL THERAPY THORACOLUMBAR TREATMENT   Patient Name: Eric Spears MRN: [id number] DOB:January 25, 1956, 68 y.o., male Today's Date: 10/01/2024  END OF SESSION:  PT End of Session - 10/01/24 1034     Visit Number 8    Number of Visits 17    Date for Recertification  10/26/24    PT Start Time 1033    PT Stop Time 1115    PT Time Calculation (min) 42 min    Activity Tolerance Patient tolerated treatment well    Behavior During Therapy [facility] for tasks assessed/performed             Past Medical History:  Diagnosis Date   Allergy    BPH (benign prostatic hyperplasia)    Depression    ED (erectile dysfunction)    Elevated liver function tests    GERD (gastroesophageal reflux disease)    Hypertension    Low HDL (under 40) 10/05   Past Surgical History:  Procedure Laterality Date   Finger trauma     NASAL SEPTUM SURGERY     Nose   Stress cardiolite  1998   Normal   Patient Active Problem List   Diagnosis Date Noted   Abrasion, leg w/ infection 08/22/2024   Abnormality of abdominal aorta 10/18/2023   Hyperlipidemia 11/01/2018   Hypertension 10/03/2018   Morbid obesity (HCC) 10/03/2018   Enlargement of abdominal aorta 12/26/2017   Fatty liver 10/19/2017   Elevated glucose level 10/18/2016   Somnolence, daytime 11/14/2014   Routine general medical examination at a health care facility 11/05/2014   Prostate cancer screening 11/05/2014   Colon cancer screening 07/03/2014   Low back pain 03/17/2011    PCP: [name], MD   REFERRING PROVIDER: [name], MD   REFERRING DIAG: Referring dx: chronic right-sided low back pain without sciatica  Rationale for Evaluation and Treatment: Rehabilitation  THERAPY DIAG:  Other low back pain  Pain in thoracic spine  ONSET DATE: 2-3 months ago, pt was working and picked up some limbs and twisted.   SUBJECTIVE:                                                                                                                                                                                            SUBJECTIVE STATEMENT: Low back pain   PERTINENT HISTORY:  Low back pain R side.  Pain started a few years ago. Pt sleeps on his R side. Back bothers him when he changes position such as getting up in the morning. Doctor thinks the pain is muscular. Has had low back pain since he was about 68  years old, pain comes and goes.   No LE radiating symptoms.      Blood pressure is controlled per pt.    PAIN:  Are you having pain? Yes: NPRS scale: 0/10 currently Pain location: R thoracolumbar paraspinal area and R iliac crest.  Pain description: sharp, radiating, electrical  Aggravating factors: changing positions such as getting up from the floor. Getting down to the ground and getting up from the ground. Getting hit from the side. Pain bothers him first thing in the morning. Relieving factors: sleeping on a recliner; supine with feet propped on pillows.  Reclined position  PRECAUTIONS: no known precautions.   RED FLAGS: Bowel or bladder incontinence: No and Cauda equina syndrome: Yes: when sitting on a toilet or something hard; rarely ever happens.     WEIGHT BEARING RESTRICTIONS: No  FALLS:  Has patient fallen in last 6 months? No  LIVING ENVIRONMENT: Lives with: lives alone Lives in: House/apartment Stairs: Yes: External: 4 steps; on left going up Has following equipment at home: None  OCCUPATION: retired  PLOF: Independent  PATIENT GOALS: make the pain Anner Baity away or find out what it is.   NEXT MD VISIT: later on this year  OBJECTIVE:  Note: Objective measures were completed at Evaluation unless otherwise noted.  DIAGNOSTIC FINDINGS:    PATIENT SURVEYS:  Modified Oswestry:  MODIFIED OSWESTRY DISABILITY SCALE  Date: 08/30/2024 Score  Pain intensity 0 = I can tolerate the pain I have without having to use pain medication.  2. Personal care (washing, dressing, etc.) 0 =  I can  take care of myself normally without causing increased pain.  3. Lifting 0 = I can lift heavy weights without increased pain.  4. Walking 0 = Pain does not prevent me from walking any distance  5. Sitting 0 =  I can sit in any chair as long as I like.  6. Standing 0 =  I can stand as long as I want without increased pain.  7. Sleeping 0 = Pain does not prevent me from sleeping well.  8. Social Life 0 = My social life is normal and does not increase my pain.  9. Traveling 0 =  I can travel anywhere without increased pain.  10. Employment/ Homemaking 1 = My normal homemaking/job activities increase my pain, but I can still perform all that is required of me  Total 1/50 (2%)   Interpretation of scores: Score Category Description  0-20% Minimal Disability The patient can cope with most living activities. Usually no treatment is indicated apart from advice on lifting, sitting and exercise  21-40% Moderate Disability The patient experiences more pain and difficulty with sitting, lifting and standing. Travel and social life are more difficult and they may be disabled from work. Personal care, sexual activity and sleeping are not grossly affected, and the patient can usually be managed by conservative means  41-60% Severe Disability Pain remains the main problem in this group, but activities of daily living are affected. These patients require a detailed investigation  61-80% Crippled Back pain impinges on all aspects of the patient's life. Positive intervention is required  81-100% Bed-bound These patients are either bed-bound or exaggerating their symptoms  [name], et al. Surgery versus conservative management of stable thoracolumbar fracture: the PRESTO feasibility RCT. [place] ([place]): [employer]; 2021 Nov. [facility] Technology Assessment, No. 25.62.) Appendix 3, Oswestry Disability Index category descriptors. Available from:  [email or web address].cz  Minimally Clinically Important Difference (MCID) =  12.8%  COGNITION: Overall cognitive status: Within functional limits for tasks assessed     SENSATION:   MUSCLE LENGTH:   POSTURE: B protracted shoulders, R scapular protraction > L, thoracic kyphosis, movement preference around L3/4 area with R trunk rotation, B genu valgus and B foot pronation.   PALPATION: Muscle tension R thoracolumbar area.   LUMBAR ROM:   AROM eval  Flexion Limited, slight aberrant movement/pt about to walk up with hands when returning to neutral.   Extension Limited with thoracolumbar spine tightness similar to area of pain  Right lateral flexion Limited with R iliac crest area tightness, focused around L5/S1 area  Left lateral flexion Limited with R iliac crest area tightness  Right rotation Limited with R thoracolumbar paraspinal tightness  Left rotation Limited with L posterior trunk tightness.    (Blank rows = not tested)  LOWER EXTREMITY ROM:     Passive  Right eval Left eval  Hip flexion    Hip extension    Hip abduction    Hip adduction    Hip internal rotation    Hip external rotation    Knee flexion    Knee extension    Ankle dorsiflexion    Ankle plantarflexion    Ankle inversion    Ankle eversion     (Blank rows = not tested)  LOWER EXTREMITY MMT:    MMT Right eval Left eval  Hip flexion 4 4-  Hip extension (seated manually resisted) 3 3+  Hip abduction 4- 4-  Hip adduction    Hip internal rotation    Hip external rotation    Knee flexion 5 4  Knee extension 5 5  Ankle dorsiflexion    Ankle plantarflexion    Ankle inversion    Ankle eversion     (Blank rows = not tested)  Reproduction of back pain when moving from one side lying position to supine to the other side lying position.   Low back pain with R S/L to sit transfer    LUMBAR SPECIAL TESTS:  (-) repeated flexion test  FUNCTIONAL TESTS:     GAIT: Distance walked: 60 ft Assistive device utilized: None Level of assistance: Complete Independence Comments: antalgic, decreased stance L LE with R pelvic drop  TREATMENT DATE: 10/01/24                                                                                                                               Subjective: Patient without any pain at start of session. He reports that his pain in the lower back is still persistent with transfers from sidelying into sitting.  No questions or concerns.   Therapeutic Exercise:   Lumbar Physioball Rollout    12x forward direction   5x R/L ea     Lumbar Posterior Pelvic Tilt    1 x 10 x 5s    Standing Pallof on Balance Pad for increasing core engagement  Resistance from R/L: 3 x 10 - 10#   Therapeutic Activity (with intent to strengthen core and gluteal muscles during functional activities such as transitioning, lifting, bending):   NuStep (seat 11) x 6 min x Level 6-2 UE/LE for improved LE strength, torso rotation and core strength. PT manually adjusted resistance.    Standing Upward Diagonal Rotation against Resistance     R/L: 2 x 10 - Blue TB   Kettlebell Squat   1 x 10 - 10#    1 x 10 - 20#     Kettle bell Swing    2 x 10 - 10# KB    Suitcase Carry (Kettle bell)    4 x 12' - 30#   4 x 12' - 40#     Bed Mobility Practice - 5 min   Sitting <> R Sidelying <> Supine    - Pt with consistent pain and visible grimace with sidelying into the supine.  PATIENT EDUCATION:  Education details: Exercise Technique  Person educated: Patient Education method: Explanation, Demonstration, Verbal cues, and Handouts Education comprehension: verbalized understanding and returned demonstration  HOME EXERCISE PROGRAM: Access Code: [email or web address] URL: [email or web address] Date: 09/18/2024 Prepared by: Lonni Pall  Exercises - Supine Transversus Abdominis Bracing - Hands on Stomach  - 3 x daily - 7 x weekly - 3  sets - 10 reps - 5 seconds hold - Supine Posterior Pelvic Tilt  - 1 x daily - 3-4 x weekly - 2-3 sets - 10-12 reps - 5 hold - Neutral Curl Up with Straight Leg  - 1 x daily - 3-4 x weekly - 2-3 sets - 10-12 reps - 10s hold - Side Stepping with Resistance at Feet  - 1 x daily - 3-4 x weekly - 2-3 sets - 10 reps - Supine Bridge  - 1 x daily - 3-4 x weekly - 2-3 sets - 8-10 reps - Standing Anti-Rotation Press with Anchored Resistance  - 1 x daily - 3-4 x weekly - 2-3 sets - 10-12 reps  Access Code: [email or web address] URL: [email or web address] Date: 09/03/2024 Prepared by: Lonni Pall  Exercises - Supine Transversus Abdominis Bracing - Hands on Stomach  - 3 x daily - 7 x weekly - 3 sets - 10 reps - 5 seconds hold - Supine Posterior Pelvic Tilt  - 1 x daily - 3-4 x weekly - 2-3 sets - 10-12 reps - 5 hold - Neutral Curl Up with Straight Leg  - 1 x daily - 3-4 x weekly - 2-3 sets - 10-12 reps - 10s hold  Access Code: [email or web address] URL: [email or web address] Date: 08/30/2024 Prepared by: Emil Glassman  Exercises - Supine Transversus Abdominis Bracing - Hands on Stomach  - 3 x daily - 7 x weekly - 3 sets - 10 reps - 5 seconds hold  ASSESSMENT:  CLINICAL IMPRESSION: Patient arrives to OPPT in management of lower back pain. Pt tolerated progression of TA activation in standing positions (suitcase carry). Good demonstration of squat form but unable to perform kettle bell swing properly due to lack of velocity and sequencing with hip extension. Multimodal cues required in order to maintain TA activation and hip hinge form. Time spent reviewing bed mobility transfer. Patient without improvement in pain with TA brace or pillow support with transferring from sitting to sidelying. Visible grimace with transition from sidelying to sitting. Based on today's performance the patient will benefit from skilled PT services in order to improve lower back pain and maximize return to PLOF.    OBJECTIVE  IMPAIRMENTS: decreased strength, improper body mechanics, postural dysfunction, and pain.   ACTIVITY LIMITATIONS: bending, standing, transfers, and bed mobility  PARTICIPATION LIMITATIONS:   PERSONAL FACTORS: Age, Fitness, Time since onset of injury/illness/exacerbation, and 3+ comorbidities: Depression, HTN, BPH, Elevated liver function tests are also affecting patient's functional outcome.   REHAB POTENTIAL: Fair    CLINICAL DECISION MAKING: Stable/uncomplicated  EVALUATION COMPLEXITY: Low   GOALS: Goals reviewed with patient? Yes  SHORT TERM GOALS: Target date: 09/14/2024  Pt will be independent with his initial HEP to decrease pain, improve strength, function, and ability to change positions more comfortably.  Baseline: Pt has started his initial HEP (08/30/2024). Goal status: INITIAL    LONG TERM GOALS: Target date: 10/26/2024  Pt will improve his B hip extension and abduction by at least 1/2 MMT grade to promote ability to change positions with less back discomfort.  Baseline:  MMT Right eval Left eval  Hip extension (seated manually resisted) 3 3+  Hip abduction 4- 4-   Goal status: INITIAL  2.  Pt will be able to transition from sitting <> standing as well as from supine <> standing without back pain to promote mobility.  Baseline: back pain reported with getting up from the sitting or lying down position (08/30/2024) Goal status: INITIAL  3.  Pt will be able to perform at least 3 floor <> stand transfers without back pain to promote mobility.  Baseline: increased back pain with the aforementioned transfer. Goal status: INITIAL   PLAN:  PT FREQUENCY: 1-2x/week  PT DURATION: 8 weeks  PLANNED INTERVENTIONS: 97110-Therapeutic exercises, 97530- Therapeutic activity, [id number]- Neuromuscular re-education, 97535- Self Care, [id number]- Manual therapy, [phone]- Aquatic Therapy, [phone]- Electrical stimulation (unattended), [phone]- Traction (mechanical), [id number]-  Ionotophoresis 4mg /ml Dexamethasone, Patient/Family education, Joint mobilization, and Spinal mobilization.  PLAN FOR NEXT SESSION: Posture, thoracolumbar mobility, trunk and glute strengthening, lumbopelvic mechanics/control   Lonni Pall PT, DPT Physical Therapist- [place]  10/01/2024, 12:12 PM

## 2024-10-03 ENCOUNTER — Ambulatory Visit

## 2024-10-03 DIAGNOSIS — M5459 Other low back pain: Secondary | ICD-10-CM | POA: Diagnosis not present

## 2024-10-03 DIAGNOSIS — M546 Pain in thoracic spine: Secondary | ICD-10-CM

## 2024-10-03 NOTE — Therapy (Signed)
 OUTPATIENT PHYSICAL THERAPY THORACOLUMBAR TREATMENT   Patient Name: Eric Spears MRN: [id number] DOB:22-May-1956, 68 y.o., male Today's Date: 10/03/2024  END OF SESSION:  PT End of Session - 10/03/24 1039     Visit Number 9    Number of Visits 17    Date for Recertification  10/26/24    PT Start Time 1037    PT Stop Time 1117    PT Time Calculation (min) 40 min    Activity Tolerance Patient tolerated treatment well    Behavior During Therapy [facility] for tasks assessed/performed              Past Medical History:  Diagnosis Date   Allergy    BPH (benign prostatic hyperplasia)    Depression    ED (erectile dysfunction)    Elevated liver function tests    GERD (gastroesophageal reflux disease)    Hypertension    Low HDL (under 40) 10/05   Past Surgical History:  Procedure Laterality Date   Finger trauma     NASAL SEPTUM SURGERY     Nose   Stress cardiolite  1998   Normal   Patient Active Problem List   Diagnosis Date Noted   Abrasion, leg w/ infection 08/22/2024   Abnormality of abdominal aorta 10/18/2023   Hyperlipidemia 11/01/2018   Hypertension 10/03/2018   Morbid obesity (HCC) 10/03/2018   Enlargement of abdominal aorta 12/26/2017   Fatty liver 10/19/2017   Elevated glucose level 10/18/2016   Somnolence, daytime 11/14/2014   Routine general medical examination at a health care facility 11/05/2014   Prostate cancer screening 11/05/2014   Colon cancer screening 07/03/2014   Low back pain 03/17/2011    PCP: [name], MD   REFERRING PROVIDER: [name], MD   REFERRING DIAG: Referring dx: chronic right-sided low back pain without sciatica  Rationale for Evaluation and Treatment: Rehabilitation  THERAPY DIAG:  Other low back pain  Pain in thoracic spine  ONSET DATE: 2-3 months ago, pt was working and picked up some limbs and twisted.   SUBJECTIVE:                                                                                                                                                                                            SUBJECTIVE STATEMENT: Low back pain   PERTINENT HISTORY:  Low back pain R side.  Pain started a few years ago. Pt sleeps on his R side. Back bothers him when he changes position such as getting up in the morning. Doctor thinks the pain is muscular. Has had low back pain since he was about  68 years old, pain comes and goes.   No LE radiating symptoms.      Blood pressure is controlled per pt.    PAIN:  Are you having pain? Yes: NPRS scale: 0/10 currently Pain location: R thoracolumbar paraspinal area and R iliac crest.  Pain description: sharp, radiating, electrical  Aggravating factors: changing positions such as getting up from the floor. Getting down to the ground and getting up from the ground. Getting hit from the side. Pain bothers him first thing in the morning. Relieving factors: sleeping on a recliner; supine with feet propped on pillows.  Reclined position  PRECAUTIONS: no known precautions.   RED FLAGS: Bowel or bladder incontinence: No and Cauda equina syndrome: Yes: when sitting on a toilet or something hard; rarely ever happens.     WEIGHT BEARING RESTRICTIONS: No  FALLS:  Has patient fallen in last 6 months? No  LIVING ENVIRONMENT: Lives with: lives alone Lives in: House/apartment Stairs: Yes: External: 4 steps; on left going up Has following equipment at home: None  OCCUPATION: retired  PLOF: Independent  PATIENT GOALS: make the pain Juliona Vales away or find out what it is.   NEXT MD VISIT: later on this year  OBJECTIVE:  Note: Objective measures were completed at Evaluation unless otherwise noted.  DIAGNOSTIC FINDINGS:    PATIENT SURVEYS:  Modified Oswestry:  MODIFIED OSWESTRY DISABILITY SCALE  Date: 08/30/2024 Score  Pain intensity 0 = I can tolerate the pain I have without having to use pain medication.  2. Personal care (washing, dressing, etc.) 0 =  I  can take care of myself normally without causing increased pain.  3. Lifting 0 = I can lift heavy weights without increased pain.  4. Walking 0 = Pain does not prevent me from walking any distance  5. Sitting 0 =  I can sit in any chair as long as I like.  6. Standing 0 =  I can stand as long as I want without increased pain.  7. Sleeping 0 = Pain does not prevent me from sleeping well.  8. Social Life 0 = My social life is normal and does not increase my pain.  9. Traveling 0 =  I can travel anywhere without increased pain.  10. Employment/ Homemaking 1 = My normal homemaking/job activities increase my pain, but I can still perform all that is required of me  Total 1/50 (2%)   Interpretation of scores: Score Category Description  0-20% Minimal Disability The patient can cope with most living activities. Usually no treatment is indicated apart from advice on lifting, sitting and exercise  21-40% Moderate Disability The patient experiences more pain and difficulty with sitting, lifting and standing. Travel and social life are more difficult and they may be disabled from work. Personal care, sexual activity and sleeping are not grossly affected, and the patient can usually be managed by conservative means  41-60% Severe Disability Pain remains the main problem in this group, but activities of daily living are affected. These patients require a detailed investigation  61-80% Crippled Back pain impinges on all aspects of the patient's life. Positive intervention is required  81-100% Bed-bound These patients are either bed-bound or exaggerating their symptoms  [name], et al. Surgery versus conservative management of stable thoracolumbar fracture: the PRESTO feasibility RCT. [place] ([place]): [employer]; 2021 Nov. [facility] Technology Assessment, No. 25.62.) Appendix 3, Oswestry Disability Index category descriptors. Available from:  [email or web address].cz  Minimally Clinically Important Difference (MCID) =  12.8%  COGNITION: Overall cognitive status: Within functional limits for tasks assessed     SENSATION:   MUSCLE LENGTH:   POSTURE: B protracted shoulders, R scapular protraction > L, thoracic kyphosis, movement preference around L3/4 area with R trunk rotation, B genu valgus and B foot pronation.   PALPATION: Muscle tension R thoracolumbar area.   LUMBAR ROM:   AROM eval  Flexion Limited, slight aberrant movement/pt about to walk up with hands when returning to neutral.   Extension Limited with thoracolumbar spine tightness similar to area of pain  Right lateral flexion Limited with R iliac crest area tightness, focused around L5/S1 area  Left lateral flexion Limited with R iliac crest area tightness  Right rotation Limited with R thoracolumbar paraspinal tightness  Left rotation Limited with L posterior trunk tightness.    (Blank rows = not tested)  LOWER EXTREMITY ROM:     Passive  Right eval Left eval  Hip flexion    Hip extension    Hip abduction    Hip adduction    Hip internal rotation    Hip external rotation    Knee flexion    Knee extension    Ankle dorsiflexion    Ankle plantarflexion    Ankle inversion    Ankle eversion     (Blank rows = not tested)  LOWER EXTREMITY MMT:    MMT Right eval Left eval  Hip flexion 4 4-  Hip extension (seated manually resisted) 3 3+  Hip abduction 4- 4-  Hip adduction    Hip internal rotation    Hip external rotation    Knee flexion 5 4  Knee extension 5 5  Ankle dorsiflexion    Ankle plantarflexion    Ankle inversion    Ankle eversion     (Blank rows = not tested)  Reproduction of back pain when moving from one side lying position to supine to the other side lying position.   Low back pain with R S/L to sit transfer    LUMBAR SPECIAL TESTS:  (-) repeated flexion test  FUNCTIONAL TESTS:     GAIT: Distance walked: 60 ft Assistive device utilized: None Level of assistance: Complete Independence Comments: antalgic, decreased stance L LE with R pelvic drop  TREATMENT DATE: 10/03/24                                                                                                                               Subjective: Patient arrived with minimal pain in the lower back. He reports that his abdominals are getting stronger and he has recently  subscribed to [employer].  No questions or concerns.   Therapeutic Exercise:   Hip Matrix    Hip Abduction R/L: 2 x 10 - 40#    Quadruped Cat Cow for lumbar mobility    1 x 10    Seated Physioball Rollout for lumbar mobility    15x forward dir  10x left dir  Therapeutic Activity (with intent to strengthen core and gluteal muscles during functional activities such as transitioning, lifting, bending):   NuStep (seat 11) x 6 min x Level 7-2 UE/LE for improved LE strength, torso rotation and core strength. PT manually adjusted resistance according to pt tolerance.    Tall Kneeling Diagonal Chop  R/L: 1 x 10, 3Kg Med Ball R/L: 1 x 10, 2 Kg Med ball  R/L: 1 x 10, 3 Kg med ball with increased velocity   Quadruped Kettlebell Pull Through  1 x 10 - 10#  KB  Seated Kettlebell Swing for lumbar extensor and core strength in sitting   3 x 10 - 10#     Suitcase Carry with KB    4 x 15' - 30#- KB in LUE   4 x 15' - 30# - KB in RUE    PATIENT EDUCATION:  Education details: Exercise Technique  Person educated: Patient Education method: Programmer, Multimedia, Demonstration, Verbal cues, and Handouts Education comprehension: verbalized understanding and returned demonstration  HOME EXERCISE PROGRAM: Access Code: [email or web address] URL: [email or web address] Date: 09/18/2024 Prepared by: Lonni Pall  Exercises - Supine Transversus Abdominis Bracing - Hands on Stomach  - 3 x daily - 7 x weekly - 3 sets - 10 reps - 5 seconds  hold - Supine Posterior Pelvic Tilt  - 1 x daily - 3-4 x weekly - 2-3 sets - 10-12 reps - 5 hold - Neutral Curl Up with Straight Leg  - 1 x daily - 3-4 x weekly - 2-3 sets - 10-12 reps - 10s hold - Side Stepping with Resistance at Feet  - 1 x daily - 3-4 x weekly - 2-3 sets - 10 reps - Supine Bridge  - 1 x daily - 3-4 x weekly - 2-3 sets - 8-10 reps - Standing Anti-Rotation Press with Anchored Resistance  - 1 x daily - 3-4 x weekly - 2-3 sets - 10-12 reps  Access Code: [email or web address] URL: [email or web address] Date: 09/03/2024 Prepared by: Lonni Pall  Exercises - Supine Transversus Abdominis Bracing - Hands on Stomach  - 3 x daily - 7 x weekly - 3 sets - 10 reps - 5 seconds hold - Supine Posterior Pelvic Tilt  - 1 x daily - 3-4 x weekly - 2-3 sets - 10-12 reps - 5 hold - Neutral Curl Up with Straight Leg  - 1 x daily - 3-4 x weekly - 2-3 sets - 10-12 reps - 10s hold  Access Code: [email or web address] URL: [email or web address] Date: 08/30/2024 Prepared by: Emil Glassman  Exercises - Supine Transversus Abdominis Bracing - Hands on Stomach  - 3 x daily - 7 x weekly - 3 sets - 10 reps - 5 seconds hold  ASSESSMENT:  CLINICAL IMPRESSION: Patient arrives to OPPT in management of lower back pain. Good carryover from last session with ability to perform suitcase carry while maintaining TA activation. Additional focus on lumbar mobility in quadruped and seated position. Good ability to perform core exercises in developmental positions (tall kneeling, quadruped) without additional pain. He still persistent pain with transfers seated <> supine. Based on today's performance the patient will benefit from skilled PT services in order to improve lower back pain and maximize return to PLOF.   OBJECTIVE IMPAIRMENTS: decreased strength, improper body mechanics, postural dysfunction, and pain.   ACTIVITY LIMITATIONS: bending, standing, transfers, and bed mobility  PARTICIPATION  LIMITATIONS:   PERSONAL FACTORS: Age, Fitness, Time since onset of injury/illness/exacerbation, and 3+ comorbidities: Depression, HTN, BPH, Elevated liver  function tests are also affecting patient's functional outcome.   REHAB POTENTIAL: Fair    CLINICAL DECISION MAKING: Stable/uncomplicated  EVALUATION COMPLEXITY: Low   GOALS: Goals reviewed with patient? Yes  SHORT TERM GOALS: Target date: 09/14/2024  Pt will be independent with his initial HEP to decrease pain, improve strength, function, and ability to change positions more comfortably.  Baseline: Pt has started his initial HEP (08/30/2024). Goal status: INITIAL    LONG TERM GOALS: Target date: 10/26/2024  Pt will improve his B hip extension and abduction by at least 1/2 MMT grade to promote ability to change positions with less back discomfort.  Baseline:  MMT Right eval Left eval  Hip extension (seated manually resisted) 3 3+  Hip abduction 4- 4-   Goal status: INITIAL  2.  Pt will be able to transition from sitting <> standing as well as from supine <> standing without back pain to promote mobility.  Baseline: back pain reported with getting up from the sitting or lying down position (08/30/2024) Goal status: INITIAL  3.  Pt will be able to perform at least 3 floor <> stand transfers without back pain to promote mobility.  Baseline: increased back pain with the aforementioned transfer. Goal status: INITIAL   PLAN:  PT FREQUENCY: 1-2x/week  PT DURATION: 8 weeks  PLANNED INTERVENTIONS: 97110-Therapeutic exercises, 97530- Therapeutic activity, [id number]- Neuromuscular re-education, 97535- Self Care, [id number]- Manual therapy, [phone]- Aquatic Therapy, [phone]- Electrical stimulation (unattended), [phone]- Traction (mechanical), [id number]- Ionotophoresis 4mg /ml Dexamethasone, Patient/Family education, Joint mobilization, and Spinal mobilization.  PLAN FOR NEXT SESSION: Posture, thoracolumbar mobility, trunk and glute strengthening,  lumbopelvic mechanics/control, Progress Note   Lonni Pall PT, DPT Physical Therapist- [place]  10/03/2024, 12:23 PM

## 2024-10-08 ENCOUNTER — Telehealth: Payer: Self-pay | Admitting: Family Medicine

## 2024-10-08 DIAGNOSIS — R7309 Other abnormal glucose: Secondary | ICD-10-CM

## 2024-10-08 DIAGNOSIS — E7800 Pure hypercholesterolemia, unspecified: Secondary | ICD-10-CM

## 2024-10-08 DIAGNOSIS — Z125 Encounter for screening for malignant neoplasm of prostate: Secondary | ICD-10-CM

## 2024-10-08 DIAGNOSIS — I10 Essential (primary) hypertension: Secondary | ICD-10-CM

## 2024-10-08 NOTE — Telephone Encounter (Signed)
-----   Message from Harlene Du sent at 09/26/2024  2:07 PM EST ----- Regarding: Labs Thurs 10/11/24 Hello,  Patient is coming in for CPE labs. Can we get orders please.   Thanks

## 2024-10-09 ENCOUNTER — Telehealth: Payer: Self-pay | Admitting: Family Medicine

## 2024-10-09 ENCOUNTER — Ambulatory Visit

## 2024-10-09 DIAGNOSIS — M546 Pain in thoracic spine: Secondary | ICD-10-CM | POA: Insufficient documentation

## 2024-10-09 DIAGNOSIS — M5459 Other low back pain: Secondary | ICD-10-CM | POA: Diagnosis present

## 2024-10-09 DIAGNOSIS — G8929 Other chronic pain: Secondary | ICD-10-CM

## 2024-10-09 NOTE — Therapy (Signed)
 OUTPATIENT PHYSICAL THERAPY THORACOLUMBAR TREATMENT/PROGRESS NOTE  Dates of reporting period  08/30/2024  to  10/09/2024     Patient Name: Eric Spears MRN: [id number] DOB:11-04-1956, 68 y.o., male Today's Date: 10/09/2024  END OF SESSION:  PT End of Session - 10/09/24 1043     Visit Number 10    Number of Visits 17    Date for Recertification  10/26/24    PT Start Time 1040    PT Stop Time 1120    PT Time Calculation (min) 40 min    Activity Tolerance Patient tolerated treatment well    Behavior During Therapy [facility] for tasks assessed/performed              Past Medical History:  Diagnosis Date   Allergy    BPH (benign prostatic hyperplasia)    Depression    ED (erectile dysfunction)    Elevated liver function tests    GERD (gastroesophageal reflux disease)    Hypertension    Low HDL (under 40) 10/05   Past Surgical History:  Procedure Laterality Date   Finger trauma     NASAL SEPTUM SURGERY     Nose   Stress cardiolite  1998   Normal   Patient Active Problem List   Diagnosis Date Noted   Abrasion, leg w/ infection 08/22/2024   Abnormality of abdominal aorta 10/18/2023   Hyperlipidemia 11/01/2018   Hypertension 10/03/2018   Morbid obesity (HCC) 10/03/2018   Enlargement of abdominal aorta 12/26/2017   Fatty liver 10/19/2017   Elevated glucose level 10/18/2016   Somnolence, daytime 11/14/2014   Routine general medical examination at a health care facility 11/05/2014   Prostate cancer screening 11/05/2014   Colon cancer screening 07/03/2014   Low back pain 03/17/2011    PCP: [name], MD   REFERRING PROVIDER: [name], MD   REFERRING DIAG: Referring dx: chronic right-sided low back pain without sciatica  Rationale for Evaluation and Treatment: Rehabilitation  THERAPY DIAG:  Other low back pain  Pain in thoracic spine  ONSET DATE: 2-3 months ago, pt was working and picked up some limbs and twisted.   SUBJECTIVE:                                                                                                                                                                                            SUBJECTIVE STATEMENT: Low back pain   PERTINENT HISTORY:  Low back pain R side.  Pain started a few years ago. Pt sleeps on his R side. Back bothers him when he changes position such as getting up in the morning. Doctor  thinks the pain is muscular. Has had low back pain since he was about 68 years old, pain comes and goes.   No LE radiating symptoms.      Blood pressure is controlled per pt.    PAIN:  Are you having pain? Yes: NPRS scale: 0/10 currently Pain location: R thoracolumbar paraspinal area and R iliac crest.  Pain description: sharp, radiating, electrical  Aggravating factors: changing positions such as getting up from the floor. Getting down to the ground and getting up from the ground. Getting hit from the side. Pain bothers him first thing in the morning. Relieving factors: sleeping on a recliner; supine with feet propped on pillows.  Reclined position  PRECAUTIONS: no known precautions.   RED FLAGS: Bowel or bladder incontinence: No and Cauda equina syndrome: Yes: when sitting on a toilet or something hard; rarely ever happens.     WEIGHT BEARING RESTRICTIONS: No  FALLS:  Has patient fallen in last 6 months? No  LIVING ENVIRONMENT: Lives with: lives alone Lives in: House/apartment Stairs: Yes: External: 4 steps; on left going up Has following equipment at home: None  OCCUPATION: retired  PLOF: Independent  PATIENT GOALS: make the pain [name] away or find out what it is.   NEXT MD VISIT: later on this year  OBJECTIVE:  Note: Objective measures were completed at Evaluation unless otherwise noted.  DIAGNOSTIC FINDINGS:    PATIENT SURVEYS:  Modified Oswestry:  MODIFIED OSWESTRY DISABILITY SCALE  Date: 08/30/2024 Score  Pain intensity 0 = I can tolerate the pain I have without having to  use pain medication.  2. Personal care (washing, dressing, etc.) 0 =  I can take care of myself normally without causing increased pain.  3. Lifting 0 = I can lift heavy weights without increased pain.  4. Walking 0 = Pain does not prevent me from walking any distance  5. Sitting 0 =  I can sit in any chair as long as I like.  6. Standing 0 =  I can stand as long as I want without increased pain.  7. Sleeping 0 = Pain does not prevent me from sleeping well.  8. Social Life 0 = My social life is normal and does not increase my pain.  9. Traveling 0 =  I can travel anywhere without increased pain.  10. Employment/ Homemaking 1 = My normal homemaking/job activities increase my pain, but I can still perform all that is required of me  Total 1/50 (2%)   Interpretation of scores: Score Category Description  0-20% Minimal Disability The patient can cope with most living activities. Usually no treatment is indicated apart from advice on lifting, sitting and exercise  21-40% Moderate Disability The patient experiences more pain and difficulty with sitting, lifting and standing. Travel and social life are more difficult and they may be disabled from work. Personal care, sexual activity and sleeping are not grossly affected, and the patient can usually be managed by conservative means  41-60% Severe Disability Pain remains the main problem in this group, but activities of daily living are affected. These patients require a detailed investigation  61-80% Crippled Back pain impinges on all aspects of the patient's life. Positive intervention is required  81-100% Bed-bound These patients are either bed-bound or exaggerating their symptoms  [name], et al. Surgery versus conservative management of stable thoracolumbar fracture: the PRESTO feasibility RCT. [place] ([place]): [employer]; 2021 Nov. [facility] Technology Assessment, No. 25.62.) Appendix 3, Oswestry  Disability Index  category descriptors. Available from: Findjewelers.cz  Minimally Clinically Important Difference (MCID) = 12.8%  COGNITION: Overall cognitive status: Within functional limits for tasks assessed     SENSATION:   MUSCLE LENGTH:   POSTURE: B protracted shoulders, R scapular protraction > L, thoracic kyphosis, movement preference around L3/4 area with R trunk rotation, B genu valgus and B foot pronation.   PALPATION: Muscle tension R thoracolumbar area.   LUMBAR ROM:   AROM eval  Flexion Limited, slight aberrant movement/pt about to walk up with hands when returning to neutral.   Extension Limited with thoracolumbar spine tightness similar to area of pain  Right lateral flexion Limited with R iliac crest area tightness, focused around L5/S1 area  Left lateral flexion Limited with R iliac crest area tightness  Right rotation Limited with R thoracolumbar paraspinal tightness  Left rotation Limited with L posterior trunk tightness.    (Blank rows = not tested)  LOWER EXTREMITY ROM:     Passive  Right eval Left eval  Hip flexion    Hip extension    Hip abduction    Hip adduction    Hip internal rotation    Hip external rotation    Knee flexion    Knee extension    Ankle dorsiflexion    Ankle plantarflexion    Ankle inversion    Ankle eversion     (Blank rows = not tested)  LOWER EXTREMITY MMT:    MMT Right eval Left eval  Hip flexion 4 4-  Hip extension (seated manually resisted) 3 3+  Hip abduction 4- 4-  Hip adduction    Hip internal rotation    Hip external rotation    Knee flexion 5 4  Knee extension 5 5  Ankle dorsiflexion    Ankle plantarflexion    Ankle inversion    Ankle eversion     (Blank rows = not tested)  Reproduction of back pain when moving from one side lying position to supine to the other side lying position.   Low back pain with R S/L to sit transfer    LUMBAR SPECIAL TESTS:  (-) repeated flexion  test  FUNCTIONAL TESTS:    GAIT: Distance walked: 60 ft Assistive device utilized: None Level of assistance: Complete Independence Comments: antalgic, decreased stance L LE with R pelvic drop  TREATMENT DATE: 10/09/24                                                                                                                               Subjective: Patient moderately sore following last PT session. He reports that he still has persistent pain with transfers in supine <> Sitting.   No questions or concerns.   Therapeutic Exercise:   Hip Matrix    Hip Abduction R/L: 3 x 10 - 40#    Horizontal Rotation against resistance   L to R: 3 x 10 - Grey TB  Therapeutic Activity (with intent to strengthen core and gluteal muscles during functional activities such as transitioning, lifting, bending):   NuStep (seat 11) x 6 min x Level 7-2 UE/LE for improved LE strength, torso rotation and core strength. PT manually adjusted resistance according to pt tolerance.    Transfer Practice (10 min):    2 Trials: Sitting <> Sidelying <> Supine     - Pain noted mostly in between Sidelying to Sitting from the R side. Visible Grimace, no improvements with core bracing while performing task    3 Trials: Floor Transfer (Supine <> Standing) with UE Support     - Good ability to transfer into quadruped however, notable pain noted transitioning into tall kneeling from sidelying position.      Med Ball Throw (15#)   L to R Rotation: 3 x 10 - 15# Med Ball    Tall Kneeling Diagonal Chop    R > L: 1 x 10, 2 Kg MB, 1 x 10 3 Kg MB    L > R: 1 x 10, 2 Kg MB, 1 x 10 3 Kg MB   PATIENT EDUCATION:  Education details: Exercise Technique  Person educated: Patient Education method: Explanation, Demonstration, Verbal cues, and Handouts Education comprehension: verbalized understanding and returned demonstration  HOME EXERCISE PROGRAM: Access Code: [email or web address] URL: [email or web address] Date:  09/18/2024 Prepared by: Lonni Pall  Exercises - Supine Transversus Abdominis Bracing - Hands on Stomach  - 3 x daily - 7 x weekly - 3 sets - 10 reps - 5 seconds hold - Supine Posterior Pelvic Tilt  - 1 x daily - 3-4 x weekly - 2-3 sets - 10-12 reps - 5 hold - Neutral Curl Up with Straight Leg  - 1 x daily - 3-4 x weekly - 2-3 sets - 10-12 reps - 10s hold - Side Stepping with Resistance at Feet  - 1 x daily - 3-4 x weekly - 2-3 sets - 10 reps - Supine Bridge  - 1 x daily - 3-4 x weekly - 2-3 sets - 8-10 reps - Standing Anti-Rotation Press with Anchored Resistance  - 1 x daily - 3-4 x weekly - 2-3 sets - 10-12 reps  Access Code: [email or web address] URL: [email or web address] Date: 09/03/2024 Prepared by: Lonni Pall  Exercises - Supine Transversus Abdominis Bracing - Hands on Stomach  - 3 x daily - 7 x weekly - 3 sets - 10 reps - 5 seconds hold - Supine Posterior Pelvic Tilt  - 1 x daily - 3-4 x weekly - 2-3 sets - 10-12 reps - 5 hold - Neutral Curl Up with Straight Leg  - 1 x daily - 3-4 x weekly - 2-3 sets - 10-12 reps - 10s hold  Access Code: [email or web address] URL: [email or web address] Date: 08/30/2024 Prepared by: Emil Glassman  Exercises - Supine Transversus Abdominis Bracing - Hands on Stomach  - 3 x daily - 7 x weekly - 3 sets - 10 reps - 5 seconds hold  ASSESSMENT:  CLINICAL IMPRESSION: Patient arrives to 10th visit warranting a progress note towards PT goals. MMT not formally assessed this visit, however patient continues to demonstrate improvements in hip musculature; he tolerates increased resistance with hip exercises (hip abd, lateral stepping against resistance). Functional transfers reassessed in today's session. Patient with good ability to perform both floor transfer (with UE support) and supine <> sitting however he still has consistent pain along the R lower lumbar region. Visible grimace with continued guarded movement during sidelying <> sitting with  bed mobility.  Without use of UE on couch support, patient unable to perform successful transition from the floor to standing.  Remainder of the session focused on dynamic core strengthening in rotational and seated positions. PT POC still remains appropriate at this time. Based on today's performance, the patient will benefit from skilled physical therapy in order to address remaining deficits and maximize return to PLOF.   OBJECTIVE IMPAIRMENTS: decreased strength, improper body mechanics, postural dysfunction, and pain.   ACTIVITY LIMITATIONS: bending, standing, transfers, and bed mobility  PARTICIPATION LIMITATIONS:   PERSONAL FACTORS: Age, Fitness, Time since onset of injury/illness/exacerbation, and 3+ comorbidities: Depression, HTN, BPH, Elevated liver function tests are also affecting patient's functional outcome.   REHAB POTENTIAL: Fair    CLINICAL DECISION MAKING: Stable/uncomplicated  EVALUATION COMPLEXITY: Low   GOALS: Goals reviewed with patient? Yes  SHORT TERM GOALS: Target date: 09/14/2024  Pt will be independent with his initial HEP to decrease pain, improve strength, function, and ability to change positions more comfortably.  Baseline: Pt has started his initial HEP (08/30/2024); 10/09/2024: 70% adherent to HEP  Goal status: Progressing    LONG TERM GOALS: Target date: 10/26/2024  Pt will improve his B hip extension and abduction by at least 1/2 MMT grade to promote ability to change positions with less back discomfort.  Baseline:  MMT Right eval Left eval  Hip extension (seated manually resisted) 3 3+  Hip abduction 4- 4-   Goal status: INITIAL  2.  Pt will be able to transition from sitting <> standing as well as from supine <> standing without back pain to promote mobility.  Baseline: back pain reported with getting up from the sitting or lying down position (08/30/2024); 10/09/2024: Pain with sidelying <> sitting transfer  Goal status: Progressing  3.   Pt will be able to perform at least 3 floor <> stand transfers without back pain to promote mobility.  Baseline: increased back pain with the aforementioned transfer.; 10/09/2024: Pain with sidelying <> half kneeling position <> standing Goal status: Progressing   PLAN:  PT FREQUENCY: 1-2x/week  PT DURATION: 8 weeks  PLANNED INTERVENTIONS: 97110-Therapeutic exercises, 97530- Therapeutic activity, 97112- Neuromuscular re-education, 97535- Self Care, 02859- Manual therapy, [phone]- Aquatic Therapy, G0283- Electrical stimulation (unattended), [phone]- Traction (mechanical), [id number]- Ionotophoresis 4mg /ml Dexamethasone, Patient/Family education, Joint mobilization, and Spinal mobilization.  PLAN FOR NEXT SESSION: Posture, thoracolumbar mobility, trunk and glute strengthening, lumbopelvic mechanics/control, Progress Note   Lonni Pall PT, DPT Physical Therapist- [place]  10/09/2024, 10:44 AM

## 2024-10-09 NOTE — Telephone Encounter (Signed)
 Received update from physical therapy  Go, Lonni PARAS, PT  Jakari Jacot, Laine LABOR, MD Good afternoon, Dr. Randeen,  Please see attachment for Mr. Cliff's PN. He's doing well increasing core stability and hip strength. He still has consistent pain with certain positions specifically with transfers. It hasn't improved with PT interventions and an Xray of his lumbar spine may be needed. No other PT concerns at this time.  Lonni Pall PT, DPT Physical Therapist- [place]      Please call pt and ask if he is open to [facility] imaging  I put in order  Thanks

## 2024-10-10 NOTE — Telephone Encounter (Signed)
 Pt notified of Dr. Graham comments and will get xray tomorrow when he is here for labs. Pt did want me to ask PCP if she thinks he needs a CT also given pain

## 2024-10-10 NOTE — Telephone Encounter (Signed)
 Let's see how xray looks to start Thanks

## 2024-10-11 ENCOUNTER — Ambulatory Visit

## 2024-10-11 ENCOUNTER — Other Ambulatory Visit (INDEPENDENT_AMBULATORY_CARE_PROVIDER_SITE_OTHER)

## 2024-10-11 ENCOUNTER — Ambulatory Visit: Payer: Self-pay | Admitting: Family Medicine

## 2024-10-11 ENCOUNTER — Ambulatory Visit (INDEPENDENT_AMBULATORY_CARE_PROVIDER_SITE_OTHER)
Admission: RE | Admit: 2024-10-11 | Discharge: 2024-10-11 | Disposition: A | Source: Ambulatory Visit | Attending: Family Medicine | Admitting: Family Medicine

## 2024-10-11 VITALS — BP 126/72 | Ht 69.0 in | Wt 266.6 lb

## 2024-10-11 DIAGNOSIS — M5459 Other low back pain: Secondary | ICD-10-CM

## 2024-10-11 DIAGNOSIS — G8929 Other chronic pain: Secondary | ICD-10-CM | POA: Diagnosis not present

## 2024-10-11 DIAGNOSIS — Z125 Encounter for screening for malignant neoplasm of prostate: Secondary | ICD-10-CM

## 2024-10-11 DIAGNOSIS — E7800 Pure hypercholesterolemia, unspecified: Secondary | ICD-10-CM

## 2024-10-11 DIAGNOSIS — Z0000 Encounter for general adult medical examination without abnormal findings: Secondary | ICD-10-CM | POA: Diagnosis not present

## 2024-10-11 DIAGNOSIS — M545 Low back pain, unspecified: Secondary | ICD-10-CM

## 2024-10-11 DIAGNOSIS — M546 Pain in thoracic spine: Secondary | ICD-10-CM

## 2024-10-11 DIAGNOSIS — I10 Essential (primary) hypertension: Secondary | ICD-10-CM

## 2024-10-11 DIAGNOSIS — R7309 Other abnormal glucose: Secondary | ICD-10-CM | POA: Diagnosis not present

## 2024-10-11 LAB — TSH: TSH: 2.92 u[IU]/mL (ref 0.35–5.50)

## 2024-10-11 LAB — CBC WITH DIFFERENTIAL/PLATELET
Basophils Absolute: 0 K/uL (ref 0.0–0.1)
Basophils Relative: 0.5 % (ref 0.0–3.0)
Eosinophils Absolute: 0.2 K/uL (ref 0.0–0.7)
Eosinophils Relative: 2.6 % (ref 0.0–5.0)
HCT: 45.9 % (ref 39.0–52.0)
Hemoglobin: 15.8 g/dL (ref 13.0–17.0)
Lymphocytes Relative: 29.2 % (ref 12.0–46.0)
Lymphs Abs: 2 K/uL (ref 0.7–4.0)
MCHC: 34.4 g/dL (ref 30.0–36.0)
MCV: 87.4 fl (ref 78.0–100.0)
Monocytes Absolute: 0.6 K/uL (ref 0.1–1.0)
Monocytes Relative: 9.2 % (ref 3.0–12.0)
Neutro Abs: 3.9 K/uL (ref 1.4–7.7)
Neutrophils Relative %: 58.5 % (ref 43.0–77.0)
Platelets: 218 K/uL (ref 150.0–400.0)
RBC: 5.25 Mil/uL (ref 4.22–5.81)
RDW: 14 % (ref 11.5–15.5)
WBC: 6.7 K/uL (ref 4.0–10.5)

## 2024-10-11 LAB — HEMOGLOBIN A1C: Hgb A1c MFr Bld: 5.5 % (ref 4.6–6.5)

## 2024-10-11 LAB — COMPREHENSIVE METABOLIC PANEL WITH GFR
ALT: 33 U/L (ref 0–53)
AST: 26 U/L (ref 0–37)
Albumin: 4.8 g/dL (ref 3.5–5.2)
Alkaline Phosphatase: 44 U/L (ref 39–117)
BUN: 16 mg/dL (ref 6–23)
CO2: 32 meq/L (ref 19–32)
Calcium: 10.8 mg/dL — ABNORMAL HIGH (ref 8.4–10.5)
Chloride: 96 meq/L (ref 96–112)
Creatinine, Ser: 0.85 mg/dL (ref 0.40–1.50)
GFR: 88.47 mL/min (ref >60.00)
Glucose, Bld: 115 mg/dL — ABNORMAL HIGH (ref 70–99)
Potassium: 4.5 meq/L (ref 3.5–5.1)
Sodium: 139 meq/L (ref 135–145)
Total Bilirubin: 0.6 mg/dL (ref 0.2–1.2)
Total Protein: 7.3 g/dL (ref 6.0–8.3)

## 2024-10-11 LAB — PSA, MEDICARE: PSA: 0.87 ng/mL (ref 0.10–4.00)

## 2024-10-11 LAB — LIPID PANEL
Cholesterol: 162 mg/dL (ref 0–200)
HDL: 33.5 mg/dL — ABNORMAL LOW (ref >39.00)
LDL Cholesterol: 105 mg/dL — ABNORMAL HIGH (ref 0–99)
NonHDL: 128.02
Total CHOL/HDL Ratio: 5
Triglycerides: 116 mg/dL (ref 0.0–149.0)
VLDL: 23.2 mg/dL (ref 0.0–40.0)

## 2024-10-11 NOTE — Progress Notes (Signed)
 Chief Complaint  Patient presents with   Medicare Wellness     Subjective:   Eric Spears is a 68 y.o. male who presents for a Medicare Annual Wellness Visit.  Visit info / Clinical Intake: Medicare Wellness Visit Type:: Subsequent Annual Wellness Visit Persons participating in visit and providing information:: patient Medicare Wellness Visit Mode:: In-person (required for WTM) Interpreter Needed?: No Pre-visit prep was completed: no AWV questionnaire completed by patient prior to visit?: yes Date:: 10/07/24 Living arrangements:: (!) lives alone Patient's Overall Health Status Rating: good Typical amount of pain: some Does pain affect daily life?: no Are you currently prescribed opioids?: no  Dietary Habits and Nutritional Risks How many meals a day?: 2 Eats fruit and vegetables daily?: (!) no (some not much says meat man) Most meals are obtained by: preparing own meals; eating out In the last 2 weeks, have you had any of the following?: none Diabetic:: no  Functional Status Activities of Daily Living (to include ambulation/medication): (Patient-Rptd) Independent Ambulation: (Patient-Rptd) Independent Medication Administration: Independent Home Management (perform basic housework or laundry): (Patient-Rptd) Independent Manage your own finances?: yes Primary transportation is: driving Concerns about vision?: no *vision screening is required for WTM* Concerns about hearing?: no  Fall Screening Falls in the past year?: (Patient-Rptd) 1 Number of falls in past year: (Patient-Rptd) 0 Was there an injury with Fall?: (Patient-Rptd) 0 Fall Risk Category Calculator: (Patient-Rptd) 1 Patient Fall Risk Level: (Patient-Rptd) Low Fall Risk  Fall Risk Patient at Risk for Falls Due to: No Fall Risks Fall risk Follow up: Education provided; Falls prevention discussed  Home and Transportation Safety: All rugs have non-skid backing?: yes All stairs or steps have railings?:  yes Grab bars in the bathtub or shower?: yes Have non-skid surface in bathtub or shower?: yes Good home lighting?: yes Regular seat belt use?: yes Hospital stays in the last year:: no  Cognitive Assessment Difficulty concentrating, remembering, or making decisions? : no Will 6CIT or Mini Cog be Completed: yes  Advance Directives (For Healthcare) Does Patient Have a Medical Advance Directive?: No Would patient like information on creating a medical advance directive?: Yes (MAU/Ambulatory/Procedural Areas - Information given)  Reviewed/Updated  Reviewed/Updated: Reviewed All (Medical, Surgical, Family, Medications, Allergies, Care Teams, Patient Goals)    Allergies (verified) Bee venom   Current Medications (verified) Outpatient Encounter Medications as of 10/11/2024  Medication Sig   amLODipine  (NORVASC ) 5 MG tablet Take 1 tablet (5 mg total) by mouth daily.   aspirin 325 MG EC tablet Take 325 mg by mouth 2 (two) times daily.    cephALEXin  (KEFLEX ) 500 MG capsule Take 1 capsule (500 mg total) by mouth 2 (two) times daily.   cetirizine (ZYRTEC) 10 MG tablet Take 10 mg by mouth daily as needed.     cyclobenzaprine  (FLEXERIL ) 10 MG tablet Take 0.5-1 tablets (5-10 mg total) by mouth 3 (three) times daily as needed for muscle spasms (back pain). Caution of sedation   hydrochlorothiazide  (HYDRODIURIL ) 25 MG tablet Take 1 tablet (25 mg total) by mouth daily.   Menaquinone-7 (K2 PO) Take 1 tablet by mouth daily.   Multiple Vitamin (MULTIVITAMIN) tablet Take 1 tablet by mouth daily.     OVER THE COUNTER MEDICATION B complex vitamin. One tablet daily.   OVER THE COUNTER MEDICATION Fish Oil, one capsule daily.   OVER THE COUNTER MEDICATION Flaxseed, one tablet daily.   VITAMIN E PO Take 1 tablet by mouth daily.   No facility-administered encounter medications on file as  of 10/11/2024.    History: Past Medical History:  Diagnosis Date   Allergy    BPH (benign prostatic hyperplasia)     Depression    ED (erectile dysfunction)    Elevated liver function tests    GERD (gastroesophageal reflux disease)    Hypertension    Low HDL (under 40) 10/05   Past Surgical History:  Procedure Laterality Date   Finger trauma     NASAL SEPTUM SURGERY     Nose   Stress cardiolite  1998   Normal   Family History  Problem Relation Age of Onset   Heart disease Father        CVA   Cancer Other        were smokers   Asthma Brother    Cancer Mother        uterine   Heart disease Mother        valve dysfunction in heart   Diabetes Maternal Grandmother    Obesity Other    Colon polyps Cousin        first cousins maternal and paternal   Colon cancer Other 61       mothers first cousin   Rectal cancer Neg Hx    Stomach cancer Neg Hx    Social History   Occupational History   Occupation: Ecologist  Tobacco Use   Smoking status: Never   Smokeless tobacco: Never  Substance and Sexual Activity   Alcohol use: Yes    Alcohol/week: 2.0 standard drinks of alcohol    Types: 2 Standard drinks or equivalent per week    Comment: I have not had a single drink in the last 3 or 4 two weeks   Drug use: Never   Sexual activity: Never   Tobacco Counseling Counseling given: Not Answered  SDOH Screenings   Food Insecurity: No Food Insecurity (10/11/2024)  Housing: Unknown (10/11/2024)  Transportation Needs: No Transportation Needs (10/11/2024)  Utilities: Not At Risk (10/11/2024)  Alcohol Screen: Low Risk  (08/21/2024)  Depression (PHQ2-9): Low Risk  (10/11/2024)  Financial Resource Strain: Low Risk  (08/21/2024)  Physical Activity: Insufficiently Active (10/11/2024)  Social Connections: Socially Isolated (10/11/2024)  Stress: No Stress Concern Present (10/11/2024)  Tobacco Use: Low Risk  (10/11/2024)  Health Literacy: Adequate Health Literacy (10/11/2024)   See flowsheets for full screening details  Depression Screen PHQ 2 & 9 Depression Scale- Over the past 2 weeks, how often have you  been bothered by any of the following problems? Little interest or pleasure in doing things: 0 Feeling down, depressed, or hopeless (PHQ Adolescent also includes...irritable): 0 PHQ-2 Total Score: 0 Trouble falling or staying asleep, or sleeping too much: 0 Feeling tired or having little energy: 0 Poor appetite or overeating (PHQ Adolescent also includes...weight loss): 0 Feeling bad about yourself - or that you are a failure or have let yourself or your family down: 0 Trouble concentrating on things, such as reading the newspaper or watching television (PHQ Adolescent also includes...like school work): 0 Moving or speaking so slowly that other people could have noticed. Or the opposite - being so fidgety or restless that you have been moving around a lot more than usual: 0 Thoughts that you would be better off dead, or of hurting yourself in some way: 0 PHQ-9 Total Score: 0 If you checked off any problems, how difficult have these problems made it for you to do your work, take care of things at home, or get along with other people?:  Not difficult at all     Goals Addressed             This Visit's Progress    Patient Stated       10/11/24-STILL WORKING ON IT: Stay as healthy as possible and lose 25 lbs by increasing to water to at least 32oz. I signed up with [employer]             Objective:    Today's Vitals   10/11/24 0809  BP: 126/72  Weight: 266 lb 9.6 oz (120.9 kg)  Height: 5' 9 (1.753 m)   Body mass index is 39.37 kg/m.  Hearing/Vision screen Vision Screening - Comments:: Not UTD w/ eye provider:[facility]-will schedule Immunizations and Health Maintenance Health Maintenance  Topic Date Due   Colonoscopy  10/21/2024   COVID-19 Vaccine (3 - 2025-26 season) 09/07/2026 (Originally 07/09/2024)   Medicare Annual Wellness (AWV)  10/11/2025   DTaP/Tdap/Td (3 - Td or Tdap) 08/22/2034   Pneumococcal Vaccine: 50+ Years  Completed   Influenza Vaccine  Completed    Hepatitis C Screening  Completed   Zoster Vaccines- Shingrix  Completed   Meningococcal B Vaccine  Aged Out        Assessment/Plan:  This is a routine wellness examination for [place].  Patient Care Team: Tower, Laine LABOR, MD as PCP - [facility], [facility]  I have personally reviewed and noted the following in the patient's chart:   Medical and social history Use of alcohol, tobacco or illicit drugs  Current medications and supplements including opioid prescriptions. Functional ability and status Nutritional status Physical activity Advanced directives List of other physicians Hospitalizations, surgeries, and ER visits in previous 12 months Vitals Screenings to include cognitive, depression, and falls Referrals and appointments  No orders of the defined types were placed in this encounter.  In addition, I have reviewed and discussed with patient certain preventive protocols, quality metrics, and best practice recommendations. A written personalized care plan for preventive services as well as general preventive health recommendations were provided to patient.   Eric LITTIE Saris, LPN   [id number]   Return in 1 year (on 10/11/2025).  After Visit Summary: (In Person-Printed) AVS printed and given to the patient  Nurse Notes: Pt has no concerns or questions. Lab visitAWV/CPE made for one year

## 2024-10-11 NOTE — Patient Instructions (Addendum)
 Mr. Eric Spears,  Thank you for taking the time for your Medicare Wellness Visit. I appreciate your continued commitment to your health goals. Please review the care plan we discussed, and feel free to reach out if I can assist you further.  Please note that Annual Wellness Visits do not include a physical exam. Some assessments may be limited, especially if the visit was conducted virtually. If needed, we may recommend an in-person follow-up with your provider.  Ongoing Care Seeing your primary care provider every 3 to 6 months helps us  monitor your health and provide consistent, personalized care.   Referrals If a referral was made during today's visit and you haven't received any updates within two weeks, please contact the referred provider directly to check on the status.  Recommended Screenings:  Health Maintenance  Topic Date Due   Colon Cancer Screening  10/21/2024   COVID-19 Vaccine (3 - 2025-26 season) 09/07/2026*   Medicare Annual Wellness Visit  10/11/2025   DTaP/Tdap/Td vaccine (3 - Td or Tdap) 08/22/2034   Pneumococcal Vaccine for age over 22  Completed   Flu Shot  Completed   Hepatitis C Screening  Completed   Zoster (Shingles) Vaccine  Completed   Meningitis B Vaccine  Aged Out  *Topic was postponed. The date shown is not the original due date.       10/07/2024    2:45 PM  Advanced Directives  Does Patient Have a Medical Advance Directive? No    Vision: Annual vision screenings are recommended for early detection of glaucoma, cataracts, and diabetic retinopathy. These exams can also reveal signs of chronic conditions such as diabetes and high blood pressure.  Dental: Annual dental screenings help detect early signs of oral cancer, gum disease, and other conditions linked to overall health, including heart disease and diabetes.

## 2024-10-11 NOTE — Therapy (Signed)
 OUTPATIENT PHYSICAL THERAPY THORACOLUMBAR TREATMENT    Patient Name: Eric Spears MRN: [id number] DOB:06/08/1956, 68 y.o., male Today's Date: 10/11/2024  END OF SESSION:  PT End of Session - 10/11/24 0948     Visit Number 11    Number of Visits 17    Date for Recertification  10/26/24    PT Start Time 0948    PT Stop Time 1030    PT Time Calculation (min) 42 min    Activity Tolerance Patient tolerated treatment well    Behavior During Therapy [facility] for tasks assessed/performed               Past Medical History:  Diagnosis Date   Allergy    BPH (benign prostatic hyperplasia)    Depression    ED (erectile dysfunction)    Elevated liver function tests    GERD (gastroesophageal reflux disease)    Hypertension    Low HDL (under 40) 10/05   Past Surgical History:  Procedure Laterality Date   Finger trauma     NASAL SEPTUM SURGERY     Nose   Stress cardiolite  1998   Normal   Patient Active Problem List   Diagnosis Date Noted   Abrasion, leg w/ infection 08/22/2024   Abnormality of abdominal aorta 10/18/2023   Hyperlipidemia 11/01/2018   Hypertension 10/03/2018   Morbid obesity (HCC) 10/03/2018   Enlargement of abdominal aorta 12/26/2017   Fatty liver 10/19/2017   Elevated glucose level 10/18/2016   Somnolence, daytime 11/14/2014   Routine general medical examination at a health care facility 11/05/2014   Prostate cancer screening 11/05/2014   Colon cancer screening 07/03/2014   Low back pain 03/17/2011    PCP: [name], MD   REFERRING PROVIDER: [name], MD   REFERRING DIAG: Referring dx: chronic right-sided low back pain without sciatica  Rationale for Evaluation and Treatment: Rehabilitation  THERAPY DIAG:  Other low back pain  Pain in thoracic spine  ONSET DATE: 2-3 months ago, pt was working and picked up some limbs and twisted.   SUBJECTIVE:                                                                                                                                                                                            SUBJECTIVE STATEMENT: Low back pain   PERTINENT HISTORY:  Low back pain R side.  Pain started a few years ago. Pt sleeps on his R side. Back bothers him when he changes position such as getting up in the morning. Doctor thinks the pain is muscular. Has had low back pain since he  was about 68 years old, pain comes and goes.   No LE radiating symptoms.      Blood pressure is controlled per pt.    PAIN:  Are you having pain? Yes: NPRS scale: 0/10 currently Pain location: R thoracolumbar paraspinal area and R iliac crest.  Pain description: sharp, radiating, electrical  Aggravating factors: changing positions such as getting up from the floor. Getting down to the ground and getting up from the ground. Getting hit from the side. Pain bothers him first thing in the morning. Relieving factors: sleeping on a recliner; supine with feet propped on pillows.  Reclined position  PRECAUTIONS: no known precautions.   RED FLAGS: Bowel or bladder incontinence: No and Cauda equina syndrome: Yes: when sitting on a toilet or something hard; rarely ever happens.     WEIGHT BEARING RESTRICTIONS: No  FALLS:  Has patient fallen in last 6 months? No  LIVING ENVIRONMENT: Lives with: lives alone Lives in: House/apartment Stairs: Yes: External: 4 steps; on left going up Has following equipment at home: None  OCCUPATION: retired  PLOF: Independent  PATIENT GOALS: make the pain Sheridan Gettel away or find out what it is.   NEXT MD VISIT: later on this year  OBJECTIVE:  Note: Objective measures were completed at Evaluation unless otherwise noted.  DIAGNOSTIC FINDINGS:    PATIENT SURVEYS:  Modified Oswestry:  MODIFIED OSWESTRY DISABILITY SCALE  Date: 08/30/2024 Score  Pain intensity 0 = I can tolerate the pain I have without having to use pain medication.  2. Personal care (washing, dressing, etc.) 0 =   I can take care of myself normally without causing increased pain.  3. Lifting 0 = I can lift heavy weights without increased pain.  4. Walking 0 = Pain does not prevent me from walking any distance  5. Sitting 0 =  I can sit in any chair as long as I like.  6. Standing 0 =  I can stand as long as I want without increased pain.  7. Sleeping 0 = Pain does not prevent me from sleeping well.  8. Social Life 0 = My social life is normal and does not increase my pain.  9. Traveling 0 =  I can travel anywhere without increased pain.  10. Employment/ Homemaking 1 = My normal homemaking/job activities increase my pain, but I can still perform all that is required of me  Total 1/50 (2%)   Interpretation of scores: Score Category Description  0-20% Minimal Disability The patient can cope with most living activities. Usually no treatment is indicated apart from advice on lifting, sitting and exercise  21-40% Moderate Disability The patient experiences more pain and difficulty with sitting, lifting and standing. Travel and social life are more difficult and they may be disabled from work. Personal care, sexual activity and sleeping are not grossly affected, and the patient can usually be managed by conservative means  41-60% Severe Disability Pain remains the main problem in this group, but activities of daily living are affected. These patients require a detailed investigation  61-80% Crippled Back pain impinges on all aspects of the patient's life. Positive intervention is required  81-100% Bed-bound These patients are either bed-bound or exaggerating their symptoms  [name], et al. Surgery versus conservative management of stable thoracolumbar fracture: the PRESTO feasibility RCT. [place] ([place]): [employer]; 2021 Nov. [facility] Technology Assessment, No. 25.62.) Appendix 3, Oswestry Disability Index category descriptors. Available from:  [email or web address]  Minimally Clinically Important  Difference (MCID) = 12.8%  COGNITION: Overall cognitive status: Within functional limits for tasks assessed     SENSATION:   MUSCLE LENGTH:   POSTURE: B protracted shoulders, R scapular protraction > L, thoracic kyphosis, movement preference around L3/4 area with R trunk rotation, B genu valgus and B foot pronation.   PALPATION: Muscle tension R thoracolumbar area.   LUMBAR ROM:   AROM eval  Flexion Limited, slight aberrant movement/pt about to walk up with hands when returning to neutral.   Extension Limited with thoracolumbar spine tightness similar to area of pain  Right lateral flexion Limited with R iliac crest area tightness, focused around L5/S1 area  Left lateral flexion Limited with R iliac crest area tightness  Right rotation Limited with R thoracolumbar paraspinal tightness  Left rotation Limited with L posterior trunk tightness.    (Blank rows = not tested)  LOWER EXTREMITY ROM:     Passive  Right eval Left eval  Hip flexion    Hip extension    Hip abduction    Hip adduction    Hip internal rotation    Hip external rotation    Knee flexion    Knee extension    Ankle dorsiflexion    Ankle plantarflexion    Ankle inversion    Ankle eversion     (Blank rows = not tested)  LOWER EXTREMITY MMT:    MMT Right eval Left eval  Hip flexion 4 4-  Hip extension (seated manually resisted) 3 3+  Hip abduction 4- 4-  Hip adduction    Hip internal rotation    Hip external rotation    Knee flexion 5 4  Knee extension 5 5  Ankle dorsiflexion    Ankle plantarflexion    Ankle inversion    Ankle eversion     (Blank rows = not tested)  Reproduction of back pain when moving from one side lying position to supine to the other side lying position.   Low back pain with R S/L to sit transfer    LUMBAR SPECIAL TESTS:  (-) repeated flexion test  FUNCTIONAL TESTS:     GAIT: Distance walked: 60 ft Assistive device utilized: None Level of assistance: Complete Independence Comments: antalgic, decreased stance L LE with R pelvic drop  TREATMENT DATE: 10/11/24                                                                                                                               Subjective: Patient moderately sore following last PT session. He reports that he still has persistent pain with transfers in supine <> Sitting.   No questions or concerns.   Therapeutic Exercise:   Lumbar Rollout with Physioball    1 x 10 - Forward Direction   1 x 10 - R/L Direction  Hip Matrix    Hip Abduction R/L: 2 x 10 - 40#    Therapeutic Activity (with intent to strengthen core and  gluteal muscles during functional activities such as transitioning, lifting, bending):   NuStep (seat 11) x 6 min x Level 7-2 UE/LE for improved LE strength, torso rotation and core strength. PT manually adjusted resistance according to pt tolerance.   Barbell Landmine Press with Torso Rotation   R/L: 1 x 10 - 45#   R/L: 2 x 10 - 55#   Kettle bell Dead lift  1 x 10 - 20#   2 x 10 - 30#    Med Ball Throw (15#)  L to R Rotation: 1 x 10 - 15# Med Ball   R to L Rotation: 1 x 10 - 15# MB  PATIENT EDUCATION:  Education details: Exercise Technique  Person educated: Patient Education method: Explanation, Demonstration, Verbal cues, and Handouts Education comprehension: verbalized understanding and returned demonstration  HOME EXERCISE PROGRAM: Access Code: [email or web address] URL: [email or web address] Date: 09/18/2024 Prepared by: Lonni Pall  Exercises - Supine Transversus Abdominis Bracing - Hands on Stomach  - 3 x daily - 7 x weekly - 3 sets - 10 reps - 5 seconds hold - Supine Posterior Pelvic Tilt  - 1 x daily - 3-4 x weekly - 2-3 sets - 10-12 reps - 5 hold - Neutral Curl Up with Straight Leg  - 1 x daily - 3-4 x weekly - 2-3 sets - 10-12 reps - 10s hold - Side  Stepping with Resistance at Feet  - 1 x daily - 3-4 x weekly - 2-3 sets - 10 reps - Supine Bridge  - 1 x daily - 3-4 x weekly - 2-3 sets - 8-10 reps - Standing Anti-Rotation Press with Anchored Resistance  - 1 x daily - 3-4 x weekly - 2-3 sets - 10-12 reps  Access Code: [email or web address] URL: [email or web address] Date: 09/03/2024 Prepared by: Lonni Pall  Exercises - Supine Transversus Abdominis Bracing - Hands on Stomach  - 3 x daily - 7 x weekly - 3 sets - 10 reps - 5 seconds hold - Supine Posterior Pelvic Tilt  - 1 x daily - 3-4 x weekly - 2-3 sets - 10-12 reps - 5 hold - Neutral Curl Up with Straight Leg  - 1 x daily - 3-4 x weekly - 2-3 sets - 10-12 reps - 10s hold  Access Code: [email or web address] URL: [email or web address] Date: 08/30/2024 Prepared by: Emil Glassman  Exercises - Supine Transversus Abdominis Bracing - Hands on Stomach  - 3 x daily - 7 x weekly - 3 sets - 10 reps - 5 seconds hold  ASSESSMENT:  CLINICAL IMPRESSION: Continued PT POC in management of R sided lower back pain. Patient continues to improve with dynamic core strengthening in various positions (sitting, standing, half kneeling). Patient still presenting with persistent pain in the R lumbar spine specifically with transfers from the R sidelying position. Today's session with a main focus on gluteal and rotational strengthening. He tolerated all interventions without exacerbation of pain in the R side. Good ability to perform dead lift with kettlebell without excessive lumbar flexion. Will review gym exercises in next session.  He continues to remain motivated to improve symptoms and has consistently attended [employer] in order to reduce weight and increase walking capacity. With strong adherence to HEP, PT strongly believes patient will make improvements with functional activities such as transfer ability. Patient nearing end of PT POC and PT will continue to strengthen core stability as  tolerated.   OBJECTIVE IMPAIRMENTS: decreased strength, improper body mechanics, postural dysfunction, and pain.   ACTIVITY LIMITATIONS: bending, standing, transfers,  and bed mobility  PARTICIPATION LIMITATIONS:   PERSONAL FACTORS: Age, Fitness, Time since onset of injury/illness/exacerbation, and 3+ comorbidities: Depression, HTN, BPH, Elevated liver function tests are also affecting patient's functional outcome.   REHAB POTENTIAL: Fair    CLINICAL DECISION MAKING: Stable/uncomplicated  EVALUATION COMPLEXITY: Low   GOALS: Goals reviewed with patient? Yes  SHORT TERM GOALS: Target date: 09/14/2024  Pt will be independent with his initial HEP to decrease pain, improve strength, function, and ability to change positions more comfortably.  Baseline: Pt has started his initial HEP (08/30/2024); 10/09/2024: 70% adherent to HEP  Goal status: Progressing    LONG TERM GOALS: Target date: 10/26/2024  Pt will improve his B hip extension and abduction by at least 1/2 MMT grade to promote ability to change positions with less back discomfort.  Baseline:  MMT Right eval Left eval  Hip extension (seated manually resisted) 3 3+  Hip abduction 4- 4-   Goal status: INITIAL  2.  Pt will be able to transition from sitting <> standing as well as from supine <> standing without back pain to promote mobility.  Baseline: back pain reported with getting up from the sitting or lying down position (08/30/2024); 10/09/2024: Pain with sidelying <> sitting transfer  Goal status: Progressing  3.  Pt will be able to perform at least 3 floor <> stand transfers without back pain to promote mobility.  Baseline: increased back pain with the aforementioned transfer.; 10/09/2024: Pain with sidelying <> half kneeling position <> standing Goal status: Progressing   PLAN:  PT FREQUENCY: 1-2x/week  PT DURATION: 8 weeks  PLANNED INTERVENTIONS: 97110-Therapeutic exercises, 97530- Therapeutic activity,  97112- Neuromuscular re-education, 97535- Self Care, 02859- Manual therapy, [phone]- Aquatic Therapy, G0283- Electrical stimulation (unattended), [phone]- Traction (mechanical), [id number]- Ionotophoresis 4mg /ml Dexamethasone, Patient/Family education, Joint mobilization, and Spinal mobilization.  PLAN FOR NEXT SESSION: Posture, thoracolumbar mobility, trunk and glute strengthening, lumbopelvic mechanics/control   Lonni Pall PT, DPT Physical Therapist- [place]  10/11/2024, 9:50 AM

## 2024-10-16 ENCOUNTER — Ambulatory Visit

## 2024-10-16 DIAGNOSIS — M5459 Other low back pain: Secondary | ICD-10-CM | POA: Diagnosis not present

## 2024-10-16 DIAGNOSIS — M546 Pain in thoracic spine: Secondary | ICD-10-CM

## 2024-10-16 NOTE — Therapy (Signed)
 OUTPATIENT PHYSICAL THERAPY THORACOLUMBAR TREATMENT    Patient Name: Eric Spears MRN: [id number] DOB:[birth year], 68 y.o., male Today's Date: 10/16/2024  END OF SESSION:  PT End of Session - 10/16/24 1032     Visit Number 12    Number of Visits 17    Date for Recertification  10/26/24    PT Start Time 1031    PT Stop Time 1115    PT Time Calculation (min) 44 min    Activity Tolerance Patient tolerated treatment well    Behavior During Therapy Eric Spears for tasks assessed/performed               Past Medical History:  Diagnosis Date   Allergy    BPH (benign prostatic hyperplasia)    Depression    ED (erectile dysfunction)    Elevated liver function tests    GERD (gastroesophageal reflux disease)    Hypertension    Low HDL (under 40) 10/05   Past Surgical History:  Procedure Laterality Date   Finger trauma     NASAL SEPTUM SURGERY     Nose   Stress cardiolite  1998   Normal   Patient Active Problem List   Diagnosis Date Noted   Abrasion, leg w/ infection 08/22/2024   Abnormality of abdominal aorta 10/18/2023   Hyperlipidemia 11/01/2018   Hypertension 10/03/2018   Morbid obesity (HCC) 10/03/2018   Enlargement of abdominal aorta 12/26/2017   Fatty liver 10/19/2017   Elevated glucose level 10/18/2016   Somnolence, daytime 11/14/2014   Routine general medical examination at a health care facility 11/05/2014   Prostate cancer screening 11/05/2014   Colon cancer screening 07/03/2014   Low back pain 03/17/2011    PCP: [name], MD   REFERRING PROVIDER: [name], MD   REFERRING DIAG: Referring dx: chronic right-sided low back pain without sciatica  Rationale for Evaluation and Treatment: Rehabilitation  THERAPY DIAG:  Other low back pain  Pain in thoracic spine  ONSET DATE: 2-3 months ago, pt was working and picked up some limbs and twisted.   SUBJECTIVE:                                                                                                                                                                                            SUBJECTIVE STATEMENT: Low back pain   PERTINENT HISTORY:  Low back pain R side.  Pain started a few years ago. Pt sleeps on his R side. Back bothers him when he changes position such as getting up in the morning. Doctor thinks the pain is muscular. Has had low back pain since he  was about 68 years old, pain comes and goes.   No LE radiating symptoms.      Blood pressure is controlled per pt.    PAIN:  Are you having pain? Yes: NPRS scale: 0/10 currently Pain location: R thoracolumbar paraspinal area and R iliac crest.  Pain description: sharp, radiating, electrical  Aggravating factors: changing positions such as getting up from the floor. Getting down to the ground and getting up from the ground. Getting hit from the side. Pain bothers him first thing in the morning. Relieving factors: sleeping on a recliner; supine with feet propped on pillows.  Reclined position  PRECAUTIONS: no known precautions.   RED FLAGS: Bowel or bladder incontinence: No and Cauda equina syndrome: Yes: when sitting on a toilet or something hard; rarely ever happens.     WEIGHT BEARING RESTRICTIONS: No  FALLS:  Has patient fallen in last 6 months? No  LIVING ENVIRONMENT: Lives with: lives alone Lives in: House/apartment Stairs: Yes: External: 4 steps; on left going up Has following equipment at home: None  OCCUPATION: retired  PLOF: Independent  PATIENT GOALS: make the pain Eric Spears away or find out what it is.   NEXT MD VISIT: later on this year  OBJECTIVE:  Note: Objective measures were completed at Evaluation unless otherwise noted.  DIAGNOSTIC FINDINGS:    PATIENT SURVEYS:  Modified Oswestry:  MODIFIED OSWESTRY DISABILITY SCALE  Date: 08/30/2024 Score  Pain intensity 0 = I can tolerate the pain I have without having to use pain medication.  2. Personal care (washing, dressing, etc.) 0 =   I can take care of myself normally without causing increased pain.  3. Lifting 0 = I can lift heavy weights without increased pain.  4. Walking 0 = Pain does not prevent me from walking any distance  5. Sitting 0 =  I can sit in any chair as long as I like.  6. Standing 0 =  I can stand as long as I want without increased pain.  7. Sleeping 0 = Pain does not prevent me from sleeping well.  8. Social Life 0 = My social life is normal and does not increase my pain.  9. Traveling 0 =  I can travel anywhere without increased pain.  10. Employment/ Homemaking 1 = My normal homemaking/job activities increase my pain, but I can still perform all that is required of me  Total 1/50 (2%)   Interpretation of scores: Score Category Description  0-20% Minimal Disability The patient can cope with most living activities. Usually no treatment is indicated apart from advice on lifting, sitting and exercise  21-40% Moderate Disability The patient experiences more pain and difficulty with sitting, lifting and standing. Travel and social life are more difficult and they may be disabled from work. Personal care, sexual activity and sleeping are not grossly affected, and the patient can usually be managed by conservative means  41-60% Severe Disability Pain remains the main problem in this group, but activities of daily living are affected. These patients require a detailed investigation  61-80% Crippled Back pain impinges on all aspects of the patient's life. Positive intervention is required  81-100% Bed-bound These patients are either bed-bound or exaggerating their symptoms  Eric Spears Eric Spears, et al. Surgery versus conservative management of stable thoracolumbar fracture: the PRESTO feasibility RCT. [place] ([place]): [employer]; 2021 Nov. [facility] Technology Assessment, No. 25.62.) Appendix 3, Oswestry Disability Index category descriptors. Available from:  Findjewelers.cz  Minimally Clinically Important  Difference (MCID) = 12.8%  COGNITION: Overall cognitive status: Within functional limits for tasks assessed     SENSATION:   MUSCLE LENGTH:   POSTURE: B protracted shoulders, R scapular protraction > L, thoracic kyphosis, movement preference around L3/4 area with R trunk rotation, B genu valgus and B foot pronation.   PALPATION: Muscle tension R thoracolumbar area.   LUMBAR ROM:   AROM eval  Flexion Limited, slight aberrant movement/pt about to walk up with hands when returning to neutral.   Extension Limited with thoracolumbar spine tightness similar to area of pain  Right lateral flexion Limited with R iliac crest area tightness, focused around L5/S1 area  Left lateral flexion Limited with R iliac crest area tightness  Right rotation Limited with R thoracolumbar paraspinal tightness  Left rotation Limited with L posterior trunk tightness.    (Blank rows = not tested)  LOWER EXTREMITY ROM:     Passive  Right eval Left eval  Hip flexion    Hip extension    Hip abduction    Hip adduction    Hip internal rotation    Hip external rotation    Knee flexion    Knee extension    Ankle dorsiflexion    Ankle plantarflexion    Ankle inversion    Ankle eversion     (Blank rows = not tested)  LOWER EXTREMITY MMT:    MMT Right eval Left eval  Hip flexion 4 4-  Hip extension (seated manually resisted) 3 3+  Hip abduction 4- 4-  Hip adduction    Hip internal rotation    Hip external rotation    Knee flexion 5 4  Knee extension 5 5  Ankle dorsiflexion    Ankle plantarflexion    Ankle inversion    Ankle eversion     (Blank rows = not tested)  Reproduction of back pain when moving from one side lying position to supine to the other side lying position.   Low back pain with R S/L to sit transfer    LUMBAR SPECIAL TESTS:  (-) repeated flexion test  FUNCTIONAL TESTS:     GAIT: Distance walked: 60 ft Assistive device utilized: None Level of assistance: Complete Independence Comments: antalgic, decreased stance L LE with R pelvic drop  TREATMENT DATE: 10/16/24                                                                                                                               Subjective: Patient reports that he has a follow up with ortho/spine dr about the xrays scheduled. Accepting of discharge in the following appointment. He is stil adherent to exercising at [employer]. No questions or concerns.   Therapeutic Exercise(for intent of lumbar mobility and core strengthening):   Lumbar Rollout with Physioball    1 x 10 - Forward Direction   1 x 10 - R/L Direction  Standing Sidebending With Kettlebell to Neutral   R/L:  2 x 10 - 30#     Time Spent reviewing Gym protocol (added below at HEP) and frequency of exercises.    Therapeutic Activity (with intent to strengthen core and gluteal muscles during functional activities such as transitioning, lifting, bending):   NuStep (seat 11) x 6 min x Level 7-2 UE/LE for improved LE strength, torso rotation and core strength. PT manually adjusted resistance according to pt tolerance.   Standing Kettlebell Torso Passes   1 x 20 passes - 20# KB  Tall Kneeling DB Torso Passes   2 x 20 passes - 20# KB  Barbell Landmine Press with Torso Rotation   R/L: 3 x 10 - 45#   Kettle bell Dead lift  2 x 15 - 15#   Seated Russian Twist   Feet Supported on floor - 3 Kg med ball   3 x 20 reps  PATIENT EDUCATION:  Education details: Exercise Technique  Person educated: Patient Education method: Programmer, Multimedia, Demonstration, Verbal cues, and Handouts Education comprehension: verbalized understanding and returned demonstration  HOME EXERCISE PROGRAM: (Gym Protocol) Access Code: [facility] URL: [email or web address] Date: 10/16/2024 Prepared by: Lonni Pall  Exercises - Barbell Landmine  Press  - 1 x daily - 3-4 x weekly - 2-3 sets - 10 reps - max 10# weight - Half Deadlift with Kettlebell  - 1 x daily - 3-4 x weekly - 2-3 sets - 10 reps - Kettlebell Suitcase Carry  - 1 x daily - 3-4 x weekly - 2-3 sets - 10-12 reps - Standing Sidebending With Kettlebell to Neutral  - 1 x daily - 3-4 x weekly - 2-3 sets - 10-12 reps - Hip Abduction Machine  - 1 x daily - 3-4 x weekly - 2-3 sets - 10 reps - Hip Adduction Machine  - 1 x daily - 3-4 x weekly - 2-3 sets - 10 reps  (HEP Protocol) Access Code: [email or web address] URL: [email or web address] Date: 09/18/2024 Prepared by: Lonni Pall  Exercises - Supine Transversus Abdominis Bracing - Hands on Stomach  - 3 x daily - 7 x weekly - 3 sets - 10 reps - 5 seconds hold - Supine Posterior Pelvic Tilt  - 1 x daily - 3-4 x weekly - 2-3 sets - 10-12 reps - 5 hold - Neutral Curl Up with Straight Leg  - 1 x daily - 3-4 x weekly - 2-3 sets - 10-12 reps - 10s hold - Side Stepping with Resistance at Feet  - 1 x daily - 3-4 x weekly - 2-3 sets - 10 reps - Supine Bridge  - 1 x daily - 3-4 x weekly - 2-3 sets - 8-10 reps - Standing Anti-Rotation Press with Anchored Resistance  - 1 x daily - 3-4 x weekly - 2-3 sets - 10-12 reps   ASSESSMENT:  CLINICAL IMPRESSION: Continued PT POC in management of R sided lower back pain. Session focused on continued core strengthening in various positions and reviewing exercises to perform at the gym. At this time the patient has demonstrated complete independence with barbell and kettlebell exercises. No exacerbation of pain in the lower back with rotational or flexion movements. Patient with good ability to transfer from the floor into standing without physical assistance. PT plans to discharge patient to HEP and review comprehensive HEP/gym program.    OBJECTIVE IMPAIRMENTS: decreased strength, improper body mechanics, postural dysfunction, and pain.   ACTIVITY LIMITATIONS: bending, standing, transfers,  and bed mobility  PARTICIPATION LIMITATIONS:   PERSONAL FACTORS: Age, Fitness, Time since onset of injury/illness/exacerbation,  and 3+ comorbidities: Depression, HTN, BPH, Elevated liver function tests are also affecting patient's functional outcome.   REHAB POTENTIAL: Fair    CLINICAL DECISION MAKING: Stable/uncomplicated  EVALUATION COMPLEXITY: Low   GOALS: Goals reviewed with patient? Yes  SHORT TERM GOALS: Target date: 09/14/2024  Pt will be independent with his initial HEP to decrease pain, improve strength, function, and ability to change positions more comfortably.  Baseline: Pt has started his initial HEP (08/30/2024); 10/09/2024: 70% adherent to HEP  Goal status: Progressing    LONG TERM GOALS: Target date: 10/26/2024  Pt will improve his B hip extension and abduction by at least 1/2 MMT grade to promote ability to change positions with less back discomfort.  Baseline:  MMT Right eval Left eval  Hip extension (seated manually resisted) 3 3+  Hip abduction 4- 4-   Goal status: INITIAL  2.  Pt will be able to transition from sitting <> standing as well as from supine <> standing without back pain to promote mobility.  Baseline: back pain reported with getting up from the sitting or lying down position (08/30/2024); 10/09/2024: Pain with sidelying <> sitting transfer  Goal status: Progressing  3.  Pt will be able to perform at least 3 floor <> stand transfers without back pain to promote mobility.  Baseline: increased back pain with the aforementioned transfer.; 10/09/2024: Pain with sidelying <> half kneeling position <> standing Goal status: Progressing   PLAN:  PT FREQUENCY: 1-2x/week  PT DURATION: 8 weeks  PLANNED INTERVENTIONS: 97110-Therapeutic exercises, 97530- Therapeutic activity, 97112- Neuromuscular re-education, 97535- Self Care, 02859- Manual therapy, [id number]- Aquatic Therapy, G0283- Electrical stimulation (unattended), [phone]- Traction (mechanical),  [id number]- Ionotophoresis 4mg /ml Dexamethasone, Patient/Family education, Joint mobilization, and Spinal mobilization.  PLAN FOR NEXT SESSION: Posture, thoracolumbar mobility, trunk and glute strengthening, lumbopelvic mechanics/control   Lonni Pall PT, DPT Physical Therapist- [place]  10/16/2024, 12:08 PM

## 2024-10-18 ENCOUNTER — Ambulatory Visit

## 2024-10-18 ENCOUNTER — Encounter: Payer: Self-pay | Admitting: Family Medicine

## 2024-10-18 ENCOUNTER — Ambulatory Visit (INDEPENDENT_AMBULATORY_CARE_PROVIDER_SITE_OTHER): Admitting: Family Medicine

## 2024-10-18 VITALS — BP 121/70 | HR 63 | Temp 98.1°F | Ht 68.5 in | Wt 270.2 lb

## 2024-10-18 DIAGNOSIS — I10 Essential (primary) hypertension: Secondary | ICD-10-CM | POA: Diagnosis not present

## 2024-10-18 DIAGNOSIS — M5459 Other low back pain: Secondary | ICD-10-CM

## 2024-10-18 DIAGNOSIS — R7309 Other abnormal glucose: Secondary | ICD-10-CM

## 2024-10-18 DIAGNOSIS — Z125 Encounter for screening for malignant neoplasm of prostate: Secondary | ICD-10-CM

## 2024-10-18 DIAGNOSIS — K760 Fatty (change of) liver, not elsewhere classified: Secondary | ICD-10-CM | POA: Diagnosis not present

## 2024-10-18 DIAGNOSIS — L918 Other hypertrophic disorders of the skin: Secondary | ICD-10-CM | POA: Insufficient documentation

## 2024-10-18 DIAGNOSIS — E7800 Pure hypercholesterolemia, unspecified: Secondary | ICD-10-CM | POA: Diagnosis not present

## 2024-10-18 DIAGNOSIS — Z1211 Encounter for screening for malignant neoplasm of colon: Secondary | ICD-10-CM | POA: Diagnosis not present

## 2024-10-18 DIAGNOSIS — M546 Pain in thoracic spine: Secondary | ICD-10-CM

## 2024-10-18 NOTE — Assessment & Plan Note (Signed)
 Lab Results  Component Value Date   CALCIUM 10.8 (H) 10/11/2024   Mild   Asked pt to message back with the amt of D he is taking Then will advise and make a plan to re check

## 2024-10-18 NOTE — Assessment & Plan Note (Signed)
 Lab Results  Component Value Date   PSA 0.87 10/11/2024   PSA 0.83 10/11/2023   PSA 0.46 07/30/2022    No family history  No significant voiding changes

## 2024-10-18 NOTE — Assessment & Plan Note (Signed)
 Lab Results  Component Value Date   ALT 33 10/11/2024   AST 26 10/11/2024   ALKPHOS 44 10/11/2024   BILITOT 0.6 10/11/2024   Reassuring labs Encouraged to keep working on weight loss

## 2024-10-18 NOTE — Assessment & Plan Note (Signed)
 Due for 5 y colonoscopy  Referral done Pt will call to schedule

## 2024-10-18 NOTE — Assessment & Plan Note (Signed)
 Lab Results  Component Value Date   HGBA1C 5.5 10/11/2024   HGBA1C 5.3 10/11/2023   HGBA1C 5.7 (H) 07/30/2022   disc imp of low glycemic diet and wt loss to prevent DM2

## 2024-10-18 NOTE — Assessment & Plan Note (Signed)
 Bothersome /multiple Interested in dermatology visit  Referral done

## 2024-10-18 NOTE — Patient Instructions (Addendum)
 Keep exercising  Continue your PT exercises     Call so schedule your colonoscopy  [facility]  [phone]  Let us  know how much vitamin D you are taking  Then I will advise further and plan a re check of calcium level    For cholesterol Avoid red meat/ fried foods/ egg yolks/ fatty breakfast meats/ butter, cheese and high fat dairy/ and shellfish     Take care of yourself   I put the referral in for dermatology  Please let us  know if you don't hear in 1-2 weeks to set that up (mychart message or call or letter)

## 2024-10-18 NOTE — Assessment & Plan Note (Signed)
 Disc goals for lipids and reasons to control them Rev last labs with pt Rev low sat fat diet in detail  LDL up slightly to 105 HDL remains low (on fish oil and flax seed in past)-is exercising now and it went up 3 points  Encouraged to keep exercising

## 2024-10-18 NOTE — Therapy (Signed)
 OUTPATIENT PHYSICAL THERAPY THORACOLUMBAR TREATMENT/DISCHARGE SUMMARY     Patient Name: Eric Spears MRN: [id number] DOB:[birth year], 68 y.o., male Today's Date: 10/18/2024  END OF SESSION:  PT End of Session - 10/18/24 1045     Visit Number 13    Number of Visits 17    Date for Recertification  10/26/24    PT Start Time 1045    PT Stop Time 1115    PT Time Calculation (min) 30 min    Activity Tolerance Patient tolerated treatment well    Behavior During Therapy [facility] for tasks assessed/performed          Past Medical History:  Diagnosis Date   Allergy    BPH (benign prostatic hyperplasia)    Depression    ED (erectile dysfunction)    Elevated liver function tests    GERD (gastroesophageal reflux disease)    Hypertension    Low HDL (under 40) 10/05   Past Surgical History:  Procedure Laterality Date   Finger trauma     NASAL SEPTUM SURGERY     Nose   Stress cardiolite  1998   Normal   Patient Active Problem List   Diagnosis Date Noted   Serum calcium elevated 10/18/2024   Abnormality of abdominal aorta 10/18/2023   Hyperlipidemia 11/01/2018   Hypertension 10/03/2018   Morbid obesity (HCC) 10/03/2018   Enlargement of abdominal aorta 12/26/2017   Fatty liver 10/19/2017   Elevated glucose level 10/18/2016   Somnolence, daytime 11/14/2014   Routine general medical examination at a health care facility 11/05/2014   Prostate cancer screening 11/05/2014   Colon cancer screening 07/03/2014   Low back pain 03/17/2011    PCP: [name], MD   REFERRING PROVIDER: [name], MD   REFERRING DIAG: Referring dx: chronic right-sided low back pain without sciatica  Rationale for Evaluation and Treatment: Rehabilitation  THERAPY DIAG:  Other low back pain  Pain in thoracic spine  ONSET DATE: 2-3 months ago, pt was working and picked up some limbs and twisted.   SUBJECTIVE:                                                                                                                                                                                            SUBJECTIVE STATEMENT: Low back pain   PERTINENT HISTORY:  Low back pain R side.  Pain started a few years ago. Pt sleeps on his R side. Back bothers him when he changes position such as getting up in the morning. Doctor thinks the pain is muscular. Has had low back pain since he was about 68 years  old, pain comes and goes.   No LE radiating symptoms.      Blood pressure is controlled per pt.    PAIN:  Are you having pain? Yes: NPRS scale: 0/10 currently Pain location: R thoracolumbar paraspinal area and R iliac crest.  Pain description: sharp, radiating, electrical  Aggravating factors: changing positions such as getting up from the floor. Getting down to the ground and getting up from the ground. Getting hit from the side. Pain bothers him first thing in the morning. Relieving factors: sleeping on a recliner; supine with feet propped on pillows.  Reclined position  PRECAUTIONS: no known precautions.   RED FLAGS: Bowel or bladder incontinence: No and Cauda equina syndrome: Yes: when sitting on a toilet or something hard; rarely ever happens.     WEIGHT BEARING RESTRICTIONS: No  FALLS:  Has patient fallen in last 6 months? No  LIVING ENVIRONMENT: Lives with: lives alone Lives in: House/apartment Stairs: Yes: External: 4 steps; on left going up Has following equipment at home: None  OCCUPATION: retired  PLOF: Independent  PATIENT GOALS: make the pain Aldine Grainger away or find out what it is.   NEXT MD VISIT: later on this year  OBJECTIVE:  Note: Objective measures were completed at Evaluation unless otherwise noted.  DIAGNOSTIC FINDINGS:    PATIENT SURVEYS:  Modified Oswestry:  MODIFIED OSWESTRY DISABILITY SCALE  Date: 08/30/2024 Score  Pain intensity 0 = I can tolerate the pain I have without having to use pain medication.  2. Personal care (washing, dressing,  etc.) 0 =  I can take care of myself normally without causing increased pain.  3. Lifting 0 = I can lift heavy weights without increased pain.  4. Walking 0 = Pain does not prevent me from walking any distance  5. Sitting 0 =  I can sit in any chair as long as I like.  6. Standing 0 =  I can stand as long as I want without increased pain.  7. Sleeping 0 = Pain does not prevent me from sleeping well.  8. Social Life 0 = My social life is normal and does not increase my pain.  9. Traveling 0 =  I can travel anywhere without increased pain.  10. Employment/ Homemaking 1 = My normal homemaking/job activities increase my pain, but I can still perform all that is required of me  Total 1/50 (2%)   Interpretation of scores: Score Category Description  0-20% Minimal Disability The patient can cope with most living activities. Usually no treatment is indicated apart from advice on lifting, sitting and exercise  21-40% Moderate Disability The patient experiences more pain and difficulty with sitting, lifting and standing. Travel and social life are more difficult and they may be disabled from work. Personal care, sexual activity and sleeping are not grossly affected, and the patient can usually be managed by conservative means  41-60% Severe Disability Pain remains the main problem in this group, but activities of daily living are affected. These patients require a detailed investigation  61-80% Crippled Back pain impinges on all aspects of the patients life. Positive intervention is required  81-100% Bed-bound These patients are either bed-bound or exaggerating their symptoms  [name], et al. Surgery versus conservative management of stable thoracolumbar fracture: the PRESTO feasibility RCT. [place] ([place]): [employer]; 2021 Nov. [facility] Technology Assessment, No. 25.62.) Appendix 3, Oswestry Disability Index category descriptors. Available from:  Findjewelers.cz  Minimally Clinically Important Difference (MCID) = 12.8%  COGNITION: Overall cognitive status: Within functional limits for tasks assessed     SENSATION:   MUSCLE LENGTH:   POSTURE: B protracted shoulders, R scapular protraction > L, thoracic kyphosis, movement preference around L3/4 area with R trunk rotation, B genu valgus and B foot pronation.   PALPATION: Muscle tension R thoracolumbar area.   LUMBAR ROM:   AROM eval  Flexion Limited, slight aberrant movement/pt about to walk up with hands when returning to neutral.   Extension Limited with thoracolumbar spine tightness similar to area of pain  Right lateral flexion Limited with R iliac crest area tightness, focused around L5/S1 area  Left lateral flexion Limited with R iliac crest area tightness  Right rotation Limited with R thoracolumbar paraspinal tightness  Left rotation Limited with L posterior trunk tightness.    (Blank rows = not tested)  LOWER EXTREMITY ROM:     Passive  Right eval Left eval  Hip flexion    Hip extension    Hip abduction    Hip adduction    Hip internal rotation    Hip external rotation    Knee flexion    Knee extension    Ankle dorsiflexion    Ankle plantarflexion    Ankle inversion    Ankle eversion     (Blank rows = not tested)  LOWER EXTREMITY MMT:    MMT Right eval Left eval  Hip flexion 4 4-  Hip extension (seated manually resisted) 3 3+  Hip abduction 4- 4-  Hip adduction    Hip internal rotation    Hip external rotation    Knee flexion 5 4  Knee extension 5 5  Ankle dorsiflexion    Ankle plantarflexion    Ankle inversion    Ankle eversion     (Blank rows = not tested)  Reproduction of back pain when moving from one side lying position to supine to the other side lying position.   Low back pain with R S/L to sit transfer    LUMBAR SPECIAL TESTS:  (-) repeated flexion test  FUNCTIONAL TESTS:     GAIT: Distance walked: 60 ft Assistive device utilized: None Level of assistance: Complete Independence Comments: antalgic, decreased stance L LE with R pelvic drop  TREATMENT DATE: 10/18/2024                                                                                                                               Subjective: Patient agreeable to d/c in today's appointment. He reports that he has recently f/u with PCP and he will make an appt to the Sp[facility] when they are available. No questions or concerns.   Therapeutic Exercise:  Time spent reviewing both HEP and Gym Protocol (frequency, sets, reps). How to break down between Hip and core splits.  Addressed questions regarding Hip abduction/adduction and proper stretch protocol for increased lumbar mobility.   Therapeutic Activity (with intent to strengthen core and gluteal  muscles during functional activities such as transitioning, lifting, bending):   NuStep (seat 11) x 5 min x Level 6-2 UE/LE for improved LE strength, torso rotation and core strength. PT manually adjusted resistance according to pt tolerance.   Kettle Bell (KB) Squat   2 x 10 - 20# KB   KB Deadlift   2 x 10 - 20# KB  KB Swing   2 x 10 - 20# KB  Tall Kneeling Diagonal Chop   R Lower - 2 x 10 - 5# Med ball  L Lower - 2 x 10 - 5# Med ball  Barbell Landmine Press with Torso Rotation             R/L: 3 x 10 - 45#   Lumbar Rollout with Physioball for increased mobility for sitting/standing and reaching   1 x 10 - Forward Direction   1 x 10 - R/L Direction   PATIENT EDUCATION:  Education details: Exercise Technique  Person educated: Patient Education method: Explanation, Demonstration, Verbal cues, and Handouts Education comprehension: verbalized understanding and returned demonstration  HOME EXERCISE PROGRAM: (Gym Protocol) Access Code: [facility] URL: [email or web address] Date: 10/16/2024 Prepared by: Lonni Pall  Exercises - Barbell Landmine Press  - 1 x daily - 3-4 x weekly - 2-3 sets - 10 reps - max 10# weight - Half Deadlift with Kettlebell  - 1 x daily - 3-4 x weekly - 2-3 sets - 10 reps - Kettlebell Suitcase Carry  - 1 x daily - 3-4 x weekly - 2-3 sets - 10-12 reps - Standing Sidebending With Kettlebell to Neutral  - 1 x daily - 3-4 x weekly - 2-3 sets - 10-12 reps - Hip Abduction Machine  - 1 x daily - 3-4 x weekly - 2-3 sets - 10 reps - Hip Adduction Machine  - 1 x daily - 3-4 x weekly - 2-3 sets - 10 reps  (HEP Protocol) Access Code: [email or web address] URL: [email or web address] Date: 09/18/2024 Prepared by: Lonni Pall  Exercises - Supine Transversus Abdominis Bracing - Hands on Stomach  - 3 x daily - 7 x weekly - 3 sets - 10 reps - 5 seconds hold - Supine Posterior Pelvic Tilt  - 1 x daily - 3-4 x weekly - 2-3 sets - 10-12 reps - 5 hold - Neutral Curl Up with Straight Leg  - 1 x daily - 3-4 x weekly - 2-3 sets - 10-12 reps - 10s hold - Side Stepping with Resistance at Feet  - 1 x daily - 3-4 x weekly - 2-3 sets - 10 reps - Supine Bridge  - 1 x daily - 3-4 x weekly - 2-3 sets - 8-10 reps - Standing Anti-Rotation Press with Anchored Resistance  - 1 x daily - 3-4 x weekly - 2-3 sets - 10-12 reps   ASSESSMENT:  CLINICAL IMPRESSION: Eric Spears is a 68 y.o. male seen for lower back pain. Pt reached end of POC and is agreeable to discharge to home with HEP. Pt met 1/3 remaining PT LT goals and demonstrates improvements in LE/core strength and lumbar mobility (see goals below). The patient's pain seems positional in nature with transfers from the right. Recent assessment of bed mobility and floor transfers without significant improvements in pain. PT interventions with minimal improvements to pain however pt endorses increased functional strength for transfers. The patient has achieved independence with daily activities and is able to perform exercises with proper technique. He has  also recently joined a gym program to being  weight loss and maintaining core strength. They no longer require skilled physical therapy interventions and are discharged with a home exercise program to maintain progress in core strength. Patient to return to PCP for f/u regarding recent imaging and further interventions for lumbar spine.     OBJECTIVE IMPAIRMENTS: decreased strength, improper body mechanics, postural dysfunction, and pain.   ACTIVITY LIMITATIONS: bending, standing, transfers, and bed mobility  PARTICIPATION LIMITATIONS:   PERSONAL FACTORS: Age, Fitness, Time since onset of injury/illness/exacerbation, and 3+ comorbidities: Depression, HTN, BPH, Elevated liver function tests are also affecting patient's functional outcome.   REHAB POTENTIAL: Fair    CLINICAL DECISION MAKING: Stable/uncomplicated  EVALUATION COMPLEXITY: Low   GOALS: Goals reviewed with patient? Yes  SHORT TERM GOALS: Target date: 09/14/2024  Pt will be independent with his initial HEP to decrease pain, improve strength, function, and ability to change positions more comfortably.  Baseline: Pt has started his initial HEP (08/30/2024); 10/09/2024: 70% adherent to HEP  Goal status: Progressing    LONG TERM GOALS: Target date: 10/26/2024  Pt will improve his B hip extension and abduction by at least 1/2 MMT grade to promote ability to change positions with less back discomfort.  Baseline:  MMT Right eval Left eval  Hip extension (seated manually resisted) 3 3+  Hip abduction 4- 4-   Goal status: INITIAL  2.  Pt will be able to transition from sitting <> standing as well as from supine <> standing without back pain to promote mobility.  Baseline: back pain reported with getting up from the sitting or lying down position (08/30/2024); 10/09/2024: Pain with sidelying <> sitting transfer  Goal status: Progressing  3.  Pt will be able to perform at least 3 floor <> stand transfers without back pain to  promote mobility.  Baseline: increased back pain with the aforementioned transfer.; 10/09/2024: Pain with sidelying <> half kneeling position <> standing Goal status: Progressing   PLAN:  PT FREQUENCY: 1-2x/week  PT DURATION: 8 weeks  PLANNED INTERVENTIONS: 97110-Therapeutic exercises, 97530- Therapeutic activity, 97112- Neuromuscular re-education, 97535- Self Care, [id number]- Manual therapy, [phone]- Aquatic Therapy, G0283- Electrical stimulation (unattended), [phone]- Traction (mechanical), [id number]- Ionotophoresis 4mg /ml Dexamethasone, Patient/Family education, Joint mobilization, and Spinal mobilization.  PLAN FOR NEXT SESSION: Discharge   Lonni Pall PT, DPT Physical Therapist- [place]  10/18/2024, 10:45 AM

## 2024-10-18 NOTE — Progress Notes (Signed)
 Subjective:    Patient ID: Eric Spears, male    DOB: Oct 15, 1956, 68 y.o.   MRN: [id number]  HPI  Pt presents for annual follow up of chronic health problems   Wt Readings from Last 3 Encounters:  10/18/24 270 lb 4 oz (122.6 kg)  10/11/24 266 lb 9.6 oz (120.9 kg)  08/22/24 267 lb (121.1 kg)   40.49 kg/m  Vitals:   10/18/24 0801 10/18/24 0830  BP: (!) 142/78 121/70  Pulse: 63   Temp: 98.1 F (36.7 C)   SpO2: 97%     Immunization History  Administered Date(s) Administered   Fluad Trivalent(High Dose 65+) 10/11/2023   INFLUENZA, HIGH DOSE SEASONAL PF 08/22/2024   Influenza,inj,Quad PF,6+ Mos 11/13/2014   Influenza-Unspecified 07/23/2016, 07/23/2017, 07/23/2018   PFIZER(Purple Top)SARS-COV-2 Vaccination 02/07/2020, 02/29/2020   Pneumococcal Conjugate-13 04/18/2020   Pneumococcal Polysaccharide-23 11/14/2021   Td 08/22/2024   Tdap 11/13/2014   Zoster Recombinant(Shingrix) 09/13/2021, 11/14/2021    Health Maintenance Due  Topic Date Due   Colonoscopy  10/21/2024   Feeling ok overall    Prostate health Lab Results  Component Value Date   PSA 0.87 10/11/2024   PSA 0.83 10/11/2023   PSA 0.46 07/30/2022   No problems urinating  Not much nocturia   Colon cancer screening   Colonoscopy 10/2019 with 5 y recall Dr Aneita    Bone health   Falls-none  [place]  Supplements - vitamin D and K    Exercise  Going to the gym  Also doing PT (be helpful)  Helped some symptoms and strength but not 100% yet  Getting up from lie to sit or sit to stand- feels grinding in his back  Not keeping her awake at night   Needs a referral to dermatology for skin tags   Mood    10/18/2024    8:07 AM 10/11/2024    8:20 AM 08/22/2024    8:09 AM 10/11/2023    8:21 AM 08/06/2022    3:35 PM  Depression screen PHQ 2/9  Decreased Interest 0 0 0 0 0  Down, Depressed, Hopeless 0 0 0 0 0  PHQ - 2 Score 0 0 0 0 0  Altered sleeping 0  0  0  Tired, decreased energy 0  0   0  Change in appetite 0  0  0  Feeling bad or failure about yourself  0  0  0  Trouble concentrating 0  0  0  Moving slowly or fidgety/restless 0  0  0  Suicidal thoughts 0  0  0  PHQ-9 Score 0  0   0   Difficult doing work/chores Not difficult at all  Not difficult at all  Not difficult at all     Data saved with a previous flowsheet row definition       HTN bp is stable today  No cp or palpitations or headaches or edema  No side effects to medicines  BP Readings from Last 3 Encounters:  10/18/24 121/70  10/11/24 126/72  08/22/24 (!) 142/68     Lab Results  Component Value Date   NA 139 10/11/2024   K 4.5 10/11/2024   CO2 32 10/11/2024   GLUCOSE 115 (H) 10/11/2024   BUN 16 10/11/2024   CREATININE 0.85 10/11/2024   CALCIUM 10.8 (H) 10/11/2024   GFR 88.47 10/11/2024   GFRNONAA 93.27 07/09/2009   Hydrochlorothiazide  25 mg daily  Amlodipine  5 mg daily   Ca mildly elevated  Glucose Lab Results  Component Value Date   HGBA1C 5.5 10/11/2024   HGBA1C 5.3 10/11/2023   HGBA1C 5.7 (H) 07/30/2022    Hyperlipidemia Lab Results  Component Value Date   CHOL 162 10/11/2024   CHOL 143 10/11/2023   CHOL 154 11/09/2022   Lab Results  Component Value Date   HDL 33.50 (L) 10/11/2024   HDL 30.50 (L) 10/11/2023   HDL 34.50 (L) 11/09/2022   Lab Results  Component Value Date   LDLCALC 105 (H) 10/11/2024   LDLCALC 90 10/11/2023   LDLCALC 97 11/09/2022   Lab Results  Component Value Date   TRIG 116.0 10/11/2024   TRIG 110.0 10/11/2023   TRIG 112.0 11/09/2022   Lab Results  Component Value Date   CHOLHDL 5 10/11/2024   CHOLHDL 5 10/11/2023   CHOLHDL 4 11/09/2022   No results found for: LDLDIRECT     Lab Results  Component Value Date   ALT 33 10/11/2024   AST 26 10/11/2024   ALKPHOS 44 10/11/2024   BILITOT 0.6 10/11/2024     Lab Results  Component Value Date   WBC 6.7 10/11/2024   HGB 15.8 10/11/2024   HCT 45.9 10/11/2024   MCV 87.4  10/11/2024   PLT 218.0 10/11/2024   Lab Results  Component Value Date   TSH 2.92 10/11/2024      Patient Active Problem List   Diagnosis Date Noted   Serum calcium elevated 10/18/2024   Abnormality of abdominal aorta 10/18/2023   Hyperlipidemia 11/01/2018   Hypertension 10/03/2018   Morbid obesity (HCC) 10/03/2018   Enlargement of abdominal aorta 12/26/2017   Fatty liver 10/19/2017   Elevated glucose level 10/18/2016   Somnolence, daytime 11/14/2014   Routine general medical examination at a health care facility 11/05/2014   Prostate cancer screening 11/05/2014   Colon cancer screening 07/03/2014   Low back pain 03/17/2011   Past Medical History:  Diagnosis Date   Allergy    BPH (benign prostatic hyperplasia)    Depression    ED (erectile dysfunction)    Elevated liver function tests    GERD (gastroesophageal reflux disease)    Hypertension    Low HDL (under 40) 10/05   Past Surgical History:  Procedure Laterality Date   Finger trauma     NASAL SEPTUM SURGERY     Nose   Stress cardiolite  1998   Normal   Social History[1] Family History  Problem Relation Age of Onset   Heart disease Father        CVA   Cancer Other        were smokers   Asthma Brother    Cancer Mother        uterine   Heart disease Mother        valve dysfunction in heart   Diabetes Maternal Grandmother    Obesity Other    Colon polyps Cousin        first cousins maternal and paternal   Colon cancer Other 21       mothers first cousin   Rectal cancer Neg Hx    Stomach cancer Neg Hx    Allergies[2] Medications Ordered Prior to Encounter[3]  Review of Systems  Constitutional:  Negative for activity change, appetite change, fatigue, fever and unexpected weight change.  HENT:  Negative for congestion, rhinorrhea, sore throat and trouble swallowing.   Eyes:  Negative for pain, redness, itching and visual disturbance.  Respiratory:  Negative for cough, chest tightness, shortness  of  breath and wheezing.   Cardiovascular:  Negative for chest pain and palpitations.  Gastrointestinal:  Negative for abdominal pain, blood in stool, constipation, diarrhea and nausea.  Endocrine: Negative for cold intolerance, heat intolerance, polydipsia and polyuria.  Genitourinary:  Negative for difficulty urinating, dysuria, frequency and urgency.  Musculoskeletal:  Negative for arthralgias, joint swelling and myalgias.  Skin:  Negative for pallor and rash.       Skin tags Bothersome   Neurological:  Negative for dizziness, tremors, weakness, numbness and headaches.  Hematological:  Negative for adenopathy. Does not bruise/bleed easily.  Psychiatric/Behavioral:  Negative for decreased concentration and dysphoric mood. The patient is not nervous/anxious.        Objective:   Physical Exam Constitutional:      General: He is not in acute distress.    Appearance: Normal appearance. He is well-developed. He is obese. He is not ill-appearing or diaphoretic.  HENT:     Head: Normocephalic and atraumatic.     Right Ear: Tympanic membrane, ear canal and external ear normal.     Left Ear: Tympanic membrane, ear canal and external ear normal.     Nose: Nose normal. No congestion.     Mouth/Throat:     Mouth: Mucous membranes are moist.     Pharynx: Oropharynx is clear. No posterior oropharyngeal erythema.  Eyes:     General: No scleral icterus.       Right eye: No discharge.        Left eye: No discharge.     Conjunctiva/sclera: Conjunctivae normal.     Pupils: Pupils are equal, round, and reactive to light.  Neck:     Thyroid : No thyromegaly.     Vascular: No carotid bruit or JVD.  Cardiovascular:     Rate and Rhythm: Normal rate and regular rhythm.     Pulses: Normal pulses.     Heart sounds: Normal heart sounds.     No gallop.  Pulmonary:     Effort: Pulmonary effort is normal. No respiratory distress.     Breath sounds: Normal breath sounds. No wheezing or rales.     Comments:  Good air exch Chest:     Chest wall: No tenderness.  Abdominal:     General: Bowel sounds are normal. There is no distension or abdominal bruit.     Palpations: Abdomen is soft. There is no mass.     Tenderness: There is no abdominal tenderness.     Hernia: No hernia is present.  Musculoskeletal:        General: No tenderness.     Cervical back: Normal range of motion and neck supple. No rigidity. No muscular tenderness.     Right lower leg: No edema.     Left lower leg: No edema.  Lymphadenopathy:     Cervical: No cervical adenopathy.  Skin:    General: Skin is warm and dry.     Coloration: Skin is not pale.     Findings: No erythema or rash.     Comments: Solar lentigines diffusely  Scattered sks  Scattered skin tags   Neurological:     Mental Status: He is alert.     Cranial Nerves: No cranial nerve deficit.     Motor: No abnormal muscle tone.     Coordination: Coordination normal.     Gait: Gait normal.     Deep Tendon Reflexes: Reflexes are normal and symmetric. Reflexes normal.  Psychiatric:  Mood and Affect: Mood normal.        Cognition and Memory: Cognition and memory normal.           Assessment & Plan:   Problem List Items Addressed This Visit       Cardiovascular and Mediastinum   Hypertension - Primary   BP: 121/70  Weight gain noted /working on this and exercising   Hydrochlorothiazide  25 mg daily  Amlodipine  5 mg daily (may be adding to pedal edema)   Plan follow up for re check  Encouraged better health habits         Digestive   Fatty liver   Lab Results  Component Value Date   ALT 33 10/11/2024   AST 26 10/11/2024   ALKPHOS 44 10/11/2024   BILITOT 0.6 10/11/2024   Reassuring labs Encouraged to keep working on weight loss         Other   Serum calcium elevated   Lab Results  Component Value Date   CALCIUM 10.8 (H) 10/11/2024   Mild   Asked pt to message back with the amt of D he is taking Then will advise and make  a plan to re check        Prostate cancer screening   Lab Results  Component Value Date   PSA 0.87 10/11/2024   PSA 0.83 10/11/2023   PSA 0.46 07/30/2022    No family history  No significant voiding changes       Morbid obesity (HCC)   Discussed how this problem influences overall health and the risks it imposes  Reviewed plan for weight loss with lower calorie diet (via better food choices (lower glycemic and portion control) along with exercise building up to or more than 30 minutes 5 days per week including some aerobic activity and strength training         Hyperlipidemia   Disc goals for lipids and reasons to control them Rev last labs with pt Rev low sat fat diet in detail  LDL up slightly to 105 HDL remains low (on fish oil and flax seed in past)-is exercising now and it went up 3 points  Encouraged to keep exercising       Elevated glucose level   Lab Results  Component Value Date   HGBA1C 5.5 10/11/2024   HGBA1C 5.3 10/11/2023   HGBA1C 5.7 (H) 07/30/2022   disc imp of low glycemic diet and wt loss to prevent DM2       Colon cancer screening   Due for 5 y colonoscopy  Referral done Pt will call to schedule          [1]  Social History Tobacco Use   Smoking status: Never   Smokeless tobacco: Never  Substance Use Topics   Alcohol use: Yes    Alcohol/week: 2.0 standard drinks of alcohol    Types: 2 Standard drinks or equivalent per week    Comment: I have not had a single drink in the last 3 or 4 two weeks   Drug use: Never  [2]  Allergies Allergen Reactions   Bee Venom   [3]  Current Outpatient Medications on File Prior to Visit  Medication Sig Dispense Refill   amLODipine  (NORVASC ) 5 MG tablet Take 1 tablet (5 mg total) by mouth daily. 90 tablet 3   aspirin 325 MG EC tablet Take 325 mg by mouth 2 (two) times daily.      cetirizine (ZYRTEC) 10 MG tablet Take 10 mg  by mouth daily as needed.       cyclobenzaprine  (FLEXERIL ) 10 MG tablet Take  0.5-1 tablets (5-10 mg total) by mouth 3 (three) times daily as needed for muscle spasms (back pain). Caution of sedation 30 tablet 0   Ergocalciferol (VITAMIN D2 PO) Take 1 capsule by mouth daily.     hydrochlorothiazide  (HYDRODIURIL ) 25 MG tablet Take 1 tablet (25 mg total) by mouth daily. 90 tablet 3   Multiple Vitamin (MULTIVITAMIN) tablet Take 1 tablet by mouth daily.       OVER THE COUNTER MEDICATION B complex vitamin. One tablet daily.     OVER THE COUNTER MEDICATION Fish Oil, one capsule daily.     OVER THE COUNTER MEDICATION Flaxseed, one tablet daily.     VITAMIN E PO Take 1 tablet by mouth daily.     VITAMIN K PO Take 1 capsule by mouth daily.     No current facility-administered medications on file prior to visit.

## 2024-10-18 NOTE — Assessment & Plan Note (Signed)
 Discussed how this problem influences overall health and the risks it imposes  Reviewed plan for weight loss with lower calorie diet (via better food choices (lower glycemic and portion control) along with exercise building up to or more than 30 minutes 5 days per week including some aerobic activity and strength training

## 2024-10-18 NOTE — Assessment & Plan Note (Signed)
 BP: 121/70  Weight gain noted /working on this and exercising   Hydrochlorothiazide  25 mg daily  Amlodipine  5 mg daily (may be adding to pedal edema)   Plan follow up for re check  Encouraged better health habits

## 2024-10-21 ENCOUNTER — Other Ambulatory Visit: Payer: Self-pay | Admitting: Family Medicine

## 2024-11-13 ENCOUNTER — Encounter: Payer: Self-pay | Admitting: Dermatology

## 2024-11-13 ENCOUNTER — Ambulatory Visit: Admitting: Dermatology

## 2024-11-13 DIAGNOSIS — B360 Pityriasis versicolor: Secondary | ICD-10-CM

## 2024-11-13 DIAGNOSIS — Z79899 Other long term (current) drug therapy: Secondary | ICD-10-CM | POA: Diagnosis not present

## 2024-11-13 DIAGNOSIS — L918 Other hypertrophic disorders of the skin: Secondary | ICD-10-CM | POA: Diagnosis not present

## 2024-11-13 DIAGNOSIS — L821 Other seborrheic keratosis: Secondary | ICD-10-CM | POA: Diagnosis not present

## 2024-11-13 DIAGNOSIS — L649 Androgenic alopecia, unspecified: Secondary | ICD-10-CM

## 2024-11-13 MED ORDER — KETOCONAZOLE 2 % EX SHAM: MEDICATED_SHAMPOO | CUTANEOUS | 5 refills | Status: AC

## 2024-11-13 NOTE — Progress Notes (Signed)
 "  New Patient Visit   Subjective  Eric Spears is a 69 y.o. male who presents for the following: rash at chest, under arms, present for years. Has been told in the past it was fungal. Tags at axilla that are irritating for patient.    The following portions of the chart were reviewed this encounter and updated as appropriate: medications, allergies, medical history  Review of Systems:  No other skin or systemic complaints except as noted in HPI or Assessment and Plan.  Objective  Well appearing patient in no apparent distress; mood and affect are within normal limits.   A focused examination was performed of the following areas: Chest, arms, axilla, neck and back  Relevant exam findings are noted in the Assessment and Plan.    Assessment & Plan   SEBORRHEIC KERATOSIS - Stuck-on, waxy, tan-brown papules and/or plaques  - Benign-appearing - Discussed benign etiology and prognosis. - Observe - Call for any changes  Tinea Versicolor Exam: scattered scaly tan macules on chest, axillae, upper back  Chronic and persistent condition with duration or expected duration over one year. Condition is symptomatic/ bothersome to patient. Not currently at goal.  Advised it can take many months for affected areas to repigment.  Use ketoconazole  shampoo as body wash once a week, especially in summer months   Tinea versicolor is a chronic recurrent skin rash causing discolored scaly spots most commonly seen on back, chest, and/or shoulders.  It is generally asymptomatic. The rash is due to overgrowth of a common type of yeast present on everyone's skin and it is not contagious.  It tends to flare more in the summer due to increased sweating on trunk.  After rash is treated, the scaliness will resolve, but the discoloration will take longer to return to normal pigmentation. The periodic use of an OTC medicated soap/shampoo with zinc or selenium sulfide can be helpful to prevent yeast overgrowth  and recurrence.  ACROCHORDONS (Skin Tags) - Removal desired by patient - Fleshy, skin-colored pedunculated papules - Benign appearing.  - Patient desires removal. Reviewed that this is not covered by insurance and they will be charged a cosmetic fee for removal. Patient signed non-covered consent.  - Prior to procedure, discussed risks of blister formation, small wound, skin dyspigmentation, or rare scar following cryotherapy.   Destruction Procedure Note Destruction method: cryotherapy   Informed consent: discussed and consent obtained   Lesion destroyed using liquid nitrogen: Yes   Outcome: patient tolerated procedure well with no complications   Post-procedure details: wound care instructions given   Locations: b/l axilla, back # of Lesions Treated: R axilla x 16, L axilla x 2, L upper back x 1  ANDROGENETIC ALOPECIA (MALE PATTERN HAIR LOSS) Exam: Frontal scalp thinning with intact frontal hairline and miniaturization   Chronic and persistent condition with duration or expected duration over one year. Condition is symptomatic/ bothersome to patient. Not currently at goal.  Androgenetic Alopecia (or Male pattern hair loss) refers to the common patterned hair loss affecting many men.  Male pattern alopecia is mediated by dihydrotestosterone which induces miniaturization of androgen-sensitive hair follicles.  It is chronic and persistent, but treatable; not curable. Topical treatment includes: - 5% topical Minoxidil Oral treatment includes: - Finasteride 1 mg qd - Minoxidil 1.25 - 5 mg qd - Dutasteride 0.5 mg qd Adjunct therapy includes: - Low Level Laser Light Therapy (LLLT) - Platelet-rich Plasma injections (PRP) - Hair Transplantation or scalp reduction  Treatment Plan: Recommend minoxidil 5% (  Rogaine for men) solution or foam to be applied to the scalp and left in. This should ideally be used twice daily for best results but it helps with hair regrowth when used at least three  times per week. Rogaine initially can cause increased hair shedding for the first few weeks but this will stop with continued use. In studies, people who used minoxidil (Rogaine) for at least 6 months had thicker hair than people who did not. Minoxidil topical (Rogaine) only works as long as it continues to be used. If if it is no longer used then the hair it has been helping to regrow can fall out. Minoxidil topical (Rogaine) can cause increased facial hair growth.   Long term medication management.  Patient is using long term (months to years) prescription medication  to control their dermatologic condition.  These medications require periodic monitoring to evaluate for efficacy and side effects and may require periodic laboratory monitoring.   TINEA VERSICOLOR   This Visit - ketoconazole  (NIZORAL ) 2 % shampoo - Wet trunk and underarms. Apply to affected areas and let sit for 5 minutes before rinsing. Repeat every day for a week, then once weekly ACROCHORDON (19) Right Axilla x 16, L uppe rback x 1, L axilla x 2 (19) - Destruction of lesion - Right Axilla x 16, L uppe rback x 1, L axilla x 2 (19) Complexity: simple   Destruction method: cryotherapy   Informed consent: discussed and consent obtained   Timeout:  patient name, date of birth, surgical site, and procedure verified Lesion destroyed using liquid nitrogen: Yes   Region frozen until ice ball extended beyond lesion: Yes   Cryo cycles: 1 or 2. Outcome: patient tolerated procedure well with no complications   Post-procedure details: wound care instructions given    ANDROGENIC ALOPECIA    Return if symptoms worsen or fail to improve.  Eric Spears, RMA, am acting as scribe for Boneta Sharps, MD .   Documentation: I have reviewed the above documentation for accuracy and completeness, and I agree with the above.  Boneta Sharps, MD    "

## 2024-11-13 NOTE — Patient Instructions (Signed)

## 2024-12-05 ENCOUNTER — Encounter: Payer: Self-pay | Admitting: Family Medicine

## 2024-12-05 NOTE — Telephone Encounter (Signed)
 We treated in October- a while ago Needs office visit with first available  Thanks   Keep very clean with soap and water in the meantime If he wears supp hose and can tolerate-try and wear during the day  UC / ER precautions please

## 2024-12-06 NOTE — Telephone Encounter (Signed)
 Pt notified of Dr. Graham comments and ER precautions given. Appt scheduled tomorrow with PCP

## 2024-12-07 ENCOUNTER — Encounter: Payer: Self-pay | Admitting: Family Medicine

## 2024-12-07 ENCOUNTER — Ambulatory Visit: Admitting: Family Medicine

## 2024-12-07 VITALS — BP 140/70 | HR 65 | Temp 97.6°F | Ht 68.5 in | Wt 266.2 lb

## 2024-12-07 DIAGNOSIS — I10 Essential (primary) hypertension: Secondary | ICD-10-CM

## 2024-12-07 DIAGNOSIS — L0390 Cellulitis, unspecified: Secondary | ICD-10-CM | POA: Insufficient documentation

## 2024-12-07 DIAGNOSIS — L03116 Cellulitis of left lower limb: Secondary | ICD-10-CM

## 2024-12-07 DIAGNOSIS — R600 Localized edema: Secondary | ICD-10-CM | POA: Insufficient documentation

## 2024-12-07 MED ORDER — CEPHALEXIN 500 MG PO CAPS: 500.0000 mg | ORAL_CAPSULE | Freq: Three times a day (TID) | ORAL | 0 refills | Status: AC

## 2024-12-07 MED ORDER — VALSARTAN 160 MG PO TABS: 160.0000 mg | ORAL_TABLET | Freq: Every day | ORAL | 0 refills | Status: AC

## 2024-12-07 NOTE — Assessment & Plan Note (Signed)
 This appears to have worsened on LLE No open skin today Encouraged to wear daily when not sleeping Maintain blood pressure less than 2 A1c 5 mg twice daily for 7 days Follow-up in 2 wk for re check   Call back and Er precautions noted in detail today

## 2024-12-07 NOTE — Progress Notes (Unsigned)
 "  Subjective:    Patient ID: Eric Spears, male    DOB: 27-Nov-1955, 69 y.o.   MRN: [id number]  HPI  Wt Readings from Last 3 Encounters:  12/07/24 266 lb 4 oz (120.8 kg)  10/18/24 270 lb 4 oz (122.6 kg)  10/11/24 266 lb 9.6 oz (120.9 kg)   39.89 kg/m  Vitals:   12/07/24 0927 12/07/24 0946  BP: (!) 146/76 (!) 140/70  Pulse: 65   Temp: 97.6 F (36.4 C)   SpO2: 99%      Pt presents for  Skin changes on leg    Had cellulitis from leg abrasion in October Treated with keflex  and updated Td  Now redness is increased again  Has made an effort not to scratch it  Some itching      Cannot rule out possible weeping- ? Damp under socks  No bleeding    Has been wearing support socks to the knee  Overall likes wearing them    HTN bp is stable today   No cp or palpitations or headaches or edema  No side effects to medicines  BP Readings from Last 3 Encounters:  12/07/24 (!) 140/70  10/18/24 121/70  10/11/24 126/72     Lab Results  Component Value Date   NA 139 10/11/2024   K 4.5 10/11/2024   CO2 32 10/11/2024   GLUCOSE 115 (H) 10/11/2024   BUN 16 10/11/2024   CREATININE 0.85 10/11/2024   CALCIUM 10.8 (H) 10/11/2024   GFR 88.47 10/11/2024   GFRNONAA 93.27 07/09/2009    Lab Results  Component Value Date   ALT 33 10/11/2024   AST 26 10/11/2024   ALKPHOS 44 10/11/2024   BILITOT 0.6 10/11/2024      Patient Active Problem List   Diagnosis Date Noted   Pedal edema 12/07/2024   Cellulitis 12/07/2024   Serum calcium elevated 10/18/2024   Skin tags, multiple acquired 10/18/2024   Abnormality of abdominal aorta 10/18/2023   Hyperlipidemia 11/01/2018   Hypertension 10/03/2018   Morbid obesity (HCC) 10/03/2018   Enlargement of abdominal aorta 12/26/2017   Fatty liver 10/19/2017   Elevated glucose level 10/18/2016   Somnolence, daytime 11/14/2014   Routine general medical examination at a health care facility 11/05/2014   Prostate cancer screening  11/05/2014   Colon cancer screening 07/03/2014   Low back pain 03/17/2011   Past Medical History:  Diagnosis Date   Allergy    BPH (benign prostatic hyperplasia)    Depression    ED (erectile dysfunction)    Elevated liver function tests    GERD (gastroesophageal reflux disease)    Hypertension    Low HDL (under 40) 10/05   Past Surgical History:  Procedure Laterality Date   Finger trauma     NASAL SEPTUM SURGERY     Nose   Stress cardiolite  1998   Normal   Social History[1] Family History  Problem Relation Age of Onset   Heart disease Father        CVA   Cancer Other        were smokers   Asthma Brother    Cancer Mother        uterine   Heart disease Mother        valve dysfunction in heart   Diabetes Maternal Grandmother    Obesity Other    Colon polyps Cousin        first cousins maternal and paternal   Colon cancer Other 43  mothers first cousin   Rectal cancer Neg Hx    Stomach cancer Neg Hx    Allergies[2] Medications Ordered Prior to Encounter[3]  Review of Systems     Objective:   Physical Exam        Assessment & Plan:   Assessment & Plan Primary hypertension BP: (!) 140/70   Most recent labs reviewed  Disc lifstyle change with low sodium diet and exercise   Plan to change amlodipine  to valsartan  160 mg daily for better blood pressure control and to decrease pedal edema  Instructed to call if any side effects  Continue hydrochlorothiazide  25 mg daily   Follow up 2 wk for visit and labs  DASH handout given    Pedal edema Worsening skin issue / venous stasis dermatitis Of both will change amlodipine  to valsartan  1 daily for blood pressure and hopefully will minimize edema Encouraged to elevate feet when sitting First visit.  Wearing knee-high support but not Weight loss to help Given the ASA and 1 Follow-up    Cellulitis of left lower extremity This appears to have worsened on LLE No open skin today Encouraged to  wear daily when not sleeping Maintain blood pressure less than 2 A1c 5 mg twice daily for 7 days Follow-up in 2 wk for re check   Call back and Er precautions noted in detail today           [1]  Social History Tobacco Use   Smoking status: Never   Smokeless tobacco: Never  Substance Use Topics   Alcohol use: Yes    Alcohol/week: 2.0 standard drinks of alcohol    Types: 2 Standard drinks or equivalent per week    Comment: I have not had a single drink in the last 3 or 4 two weeks   Drug use: Never  [2]  Allergies Allergen Reactions   Bee Venom Anaphylaxis  [3]  Current Outpatient Medications on File Prior to Visit  Medication Sig Dispense Refill   aspirin 325 MG EC tablet Take 325 mg by mouth 2 (two) times daily.      cetirizine (ZYRTEC) 10 MG tablet Take 10 mg by mouth daily as needed.       cyclobenzaprine  (FLEXERIL ) 10 MG tablet Take 0.5-1 tablets (5-10 mg total) by mouth 3 (three) times daily as needed for muscle spasms (back pain). Caution of sedation 30 tablet 0   Ergocalciferol (VITAMIN D2 PO) Take 1 capsule by mouth daily.     hydrochlorothiazide  (HYDRODIURIL ) 25 MG tablet Take 1 tablet (25 mg total) by mouth daily. 90 tablet 3   ketoconazole  (NIZORAL ) 2 % shampoo Wet trunk and underarms. Apply to affected areas and let sit for 5 minutes before rinsing. Repeat every day for a week, then once weekly 120 mL 5   Multiple Vitamin (MULTIVITAMIN) tablet Take 1 tablet by mouth daily.       OVER THE COUNTER MEDICATION B complex vitamin. One tablet daily.     OVER THE COUNTER MEDICATION Fish Oil, one capsule daily.     OVER THE COUNTER MEDICATION Flaxseed, one tablet daily.     VITAMIN E PO Take 1 tablet by mouth daily.     VITAMIN K PO Take 1 capsule by mouth daily.     No current facility-administered medications on file prior to visit.   "

## 2024-12-07 NOTE — Assessment & Plan Note (Signed)
 BP: (!) 140/70   Most recent labs reviewed  Disc lifstyle change with low sodium diet and exercise   Plan to change amlodipine  to valsartan  160 mg daily for better blood pressure control and to decrease pedal edema  Instructed to call if any side effects  Continue hydrochlorothiazide  25 mg daily   Follow up 2 wk for visit and labs  DASH handout given

## 2024-12-07 NOTE — Patient Instructions (Addendum)
 Hold your amlodipine    Take valsatan 160 mg once daily  (if any side effects please let me know, including itching or rash)  Continue the hydrochlorothiazide  25 mg daily    Avoid excess sodium in diet   When you sit/try and elevate your feet   Wear support socks when not sleeping   Keep skin clean soap and water Antibiotic ointment is ok also  Try not to scratch   Take keflex  as directed   Follow up in about 2 weeks    If any worsening of redness or swelling

## 2024-12-07 NOTE — Assessment & Plan Note (Signed)
 Worsening skin issue / venous stasis dermatitis Of both will change amlodipine  to valsartan  1 daily for blood pressure and hopefully will minimize edema Encouraged to elevate feet when sitting First visit.  Wearing knee-high support but not Weight loss to help Given the ASA and 1 Follow-up

## 2024-12-10 ENCOUNTER — Encounter: Payer: Self-pay | Admitting: Gastroenterology

## 2025-10-14 ENCOUNTER — Other Ambulatory Visit

## 2025-10-15 ENCOUNTER — Ambulatory Visit

## 2025-10-15 ENCOUNTER — Other Ambulatory Visit

## 2025-10-21 ENCOUNTER — Encounter: Admitting: Family Medicine
